# Patient Record
Sex: Male | Born: 1950 | Race: White | Hispanic: No | Marital: Married | Smoking: Former smoker
Health system: Southern US, Community
[De-identification: ages and names within clinical notes are randomized; demographics above are authoritative.]

## PROBLEM LIST (undated history)

## (undated) DIAGNOSIS — I10 Essential (primary) hypertension: Secondary | ICD-10-CM

## (undated) DIAGNOSIS — I4891 Unspecified atrial fibrillation: Secondary | ICD-10-CM

## (undated) DIAGNOSIS — E785 Hyperlipidemia, unspecified: Secondary | ICD-10-CM

## (undated) DIAGNOSIS — K635 Polyp of colon: Secondary | ICD-10-CM

## (undated) DIAGNOSIS — E119 Type 2 diabetes mellitus without complications: Secondary | ICD-10-CM

## (undated) HISTORY — DX: Type 2 diabetes mellitus without complications: E11.9

## (undated) HISTORY — DX: Polyp of colon: K63.5

## (undated) HISTORY — PX: CARPAL TUNNEL RELEASE: SHX101

## (undated) HISTORY — PX: UMBILICAL HERNIA REPAIR: SHX196

## (undated) HISTORY — PX: ROTATOR CUFF REPAIR: SHX139

## (undated) HISTORY — DX: Essential (primary) hypertension: I10

## (undated) HISTORY — DX: Hyperlipidemia, unspecified: E78.5

## (undated) HISTORY — DX: Unspecified atrial fibrillation: I48.91

---

## 2012-05-28 ENCOUNTER — Encounter: Payer: Self-pay | Admitting: Physician Assistant

## 2012-06-02 ENCOUNTER — Encounter: Payer: Self-pay | Admitting: Physician Assistant

## 2012-06-02 ENCOUNTER — Ambulatory Visit (INDEPENDENT_AMBULATORY_CARE_PROVIDER_SITE_OTHER): Payer: BC Managed Care – PPO | Admitting: Physician Assistant

## 2012-06-02 VITALS — BP 136/72 | HR 97 | Ht 70.0 in | Wt 274.2 lb

## 2012-06-02 DIAGNOSIS — E119 Type 2 diabetes mellitus without complications: Secondary | ICD-10-CM | POA: Insufficient documentation

## 2012-06-02 DIAGNOSIS — I4891 Unspecified atrial fibrillation: Secondary | ICD-10-CM

## 2012-06-02 NOTE — Patient Instructions (Addendum)
You do not need a colonoscopy at this time. You can have a follow up colonoscopy in 2 years.

## 2012-06-02 NOTE — Progress Notes (Signed)
  Subjective:    Patient ID: Kenneth Hahn, male    DOB: July 01, 1950, 63 y.o.   MRN: [id number]  HPI    Review of Systems  Constitutional: Negative.   HENT: Negative.   Eyes: Negative.   Respiratory: Negative.   Cardiovascular: Negative.   Gastrointestinal: Positive for diarrhea.  Endocrine: Negative.   Genitourinary: Negative.   Allergic/Immunologic: Negative.   Neurological: Negative.   Hematological: Negative.   Psychiatric/Behavioral: Negative.    Outpatient Encounter Prescriptions as of 06/02/2012  Medication Sig Dispense Refill  . amLODipine-benazepril (LOTREL) 5-20 MG per capsule Take 1 capsule by mouth daily.      Marland Kitchen atenolol (TENORMIN) 25 MG tablet Take 25 mg by mouth 2 (two) times daily. Take one tablet morning and evening      . Cholecalciferol (VITAMIN D-3) 5000 UNITS TABS Take 1 tablet by mouth daily.      . Choline Fenofibrate (TRILIPIX) 135 MG capsule Take 135 mg by mouth daily.      . Fish Oil-Cholecalciferol (FISH OIL + D3) 1200-1000 MG-UNIT CAPS Take 1 tablet by mouth daily.      Marland Kitchen glimepiride (AMARYL) 2 MG tablet Take 2 mg by mouth daily before breakfast.      . Glucos-Chondroit-Hyaluron-MSM (GLUCOSAMINE CHONDROITIN JOINT PO) Take 1 tablet by mouth daily.      . Multiple Vitamin (MULTIVITAMIN) tablet Take 1 tablet by mouth daily.      . sitaGLIPtan-metformin (JANUMET) 50-1000 MG per tablet Take 1 tablet by mouth 2 (two) times daily with a meal.      . warfarin (COUMADIN) 5 MG tablet Take 5 mg by mouth daily.       No facility-administered encounter medications on file as of 06/02/2012.  No Known Allergies  Patient Active Problem List  Diagnosis  . Atrial fibrillation  . HTN (hypertension)  . DM (diabetes mellitus)  . Hx of colonic polyps  . Chronic anticoagulation   History  Substance Use Topics  . Smoking status: Former Smoker    Quit date: 03/26/1973  . Smokeless tobacco: Never Used  . Alcohol Use: Yes     Comment: 2 drinks a day   family history  includes Diabetes in his father; Heart disease in his father; Lymphoma in his mother; and Squamous cell carcinoma in his sister.     Objective:   Physical Exam well-developed older white male in no acute distress, pleasant blood pressure 136 her 72 pulse 97 height 5 foot 10 weight 274. HEENT; nontraumatic normocephalic EOMI PERRLA sclera anicteric conjunctiva pink, Neck; supple no JVD, Cardiovascular; irregular rate and rhythm with S1-S2 no murmur or gallop, Pulmonary ;clear bilaterally, Abdomen; large soft nontender nondistended no palpable mass or hepatosplenomegaly he has some mild tenderness on the right directly over the right lateral costal margin., Rectal; exam not done, Extremities; no clubbing cyanosis or edema skin warm and dry, Psych;ood and affect normal and appropriate        Assessment & Plan:  #30 62 year old male with history of hyperplastic colon polyps with last colonoscopy 2010 year to discuss timing of followup colonoscopy. Average risk with no family history of colon cancer #2 chronic anticoagulation with Coumadin #3 atrial fibrillation #4hypertension #5 adult onset diabetes mellitus #6 intermittent diarrhea no specific triggers, most consistent with IBS  Plan; patient will need followup colonoscopy at 7 year interval.  Have asked him to come back in for followup in 2 years or sooner if he develops any other GI symptoms.

## 2013-01-29 ENCOUNTER — Other Ambulatory Visit: Payer: Self-pay

## 2014-02-08 ENCOUNTER — Emergency Department: Payer: Self-pay | Admitting: Emergency Medicine

## 2014-02-08 LAB — URINALYSIS, COMPLETE
Bacteria: NONE SEEN
Bilirubin,UR: NEGATIVE
Blood: NEGATIVE
Glucose,UR: >=500 mg/dL (ref 0–75)
LEUKOCYTE ESTERASE: NEGATIVE
Nitrite: NEGATIVE
PROTEIN: NEGATIVE
Ph: 5 (ref 4.5–8.0)
RBC,UR: 1 /HPF (ref 0–5)
Specific Gravity: 1.028 (ref 1.003–1.030)
Squamous Epithelial: NONE SEEN

## 2014-02-08 LAB — COMPREHENSIVE METABOLIC PANEL
ALBUMIN: 4.2 g/dL (ref 3.4–5.0)
ALK PHOS: 65 U/L
AST: 87 U/L — AB (ref 15–37)
Anion Gap: 10 (ref 7–16)
BUN: 21 mg/dL — AB (ref 7–18)
Bilirubin,Total: 0.9 mg/dL (ref 0.2–1.0)
Calcium, Total: 8.9 mg/dL (ref 8.5–10.1)
Chloride: 96 mmol/L — ABNORMAL LOW (ref 98–107)
Co2: 27 mmol/L (ref 21–32)
Creatinine: 1.84 mg/dL — ABNORMAL HIGH (ref 0.60–1.30)
EGFR (African American): 48 — ABNORMAL LOW
EGFR (Non-African Amer.): 39 — ABNORMAL LOW
Glucose: 525 mg/dL (ref 65–99)
Osmolality: 293 (ref 275–301)
Potassium: 4.4 mmol/L (ref 3.5–5.1)
SGPT (ALT): 66 U/L — ABNORMAL HIGH
SODIUM: 133 mmol/L — AB (ref 136–145)
TOTAL PROTEIN: 7.9 g/dL (ref 6.4–8.2)

## 2014-02-08 LAB — CBC
HCT: 42.7 % (ref 40.0–52.0)
HGB: 14.5 g/dL (ref 13.0–18.0)
MCH: 32.5 pg (ref 26.0–34.0)
MCHC: 33.9 g/dL (ref 32.0–36.0)
MCV: 96 fL (ref 80–100)
PLATELETS: 193 10*3/uL (ref 150–440)
RBC: 4.44 10*6/uL (ref 4.40–5.90)
RDW: 12.3 % (ref 11.5–14.5)
WBC: 9.6 10*3/uL (ref 3.8–10.6)

## 2015-11-15 ENCOUNTER — Ambulatory Visit
Admission: RE | Admit: 2015-11-15 | Discharge: 2015-11-15 | Disposition: A | Discharge status: Home / Self Care | Payer: Medicare Other | Source: Ambulatory Visit | Attending: Family Medicine | Admitting: Family Medicine

## 2015-11-15 ENCOUNTER — Other Ambulatory Visit: Payer: Self-pay | Admitting: Family Medicine

## 2015-11-15 DIAGNOSIS — R609 Edema, unspecified: Secondary | ICD-10-CM

## 2015-11-15 DIAGNOSIS — M7989 Other specified soft tissue disorders: Secondary | ICD-10-CM | POA: Diagnosis present

## 2015-12-27 DIAGNOSIS — I200 Unstable angina: Secondary | ICD-10-CM | POA: Diagnosis present

## 2016-01-04 ENCOUNTER — Encounter
Admission: RE | Disposition: A | Discharge status: Home / Self Care | Payer: Self-pay | Source: Ambulatory Visit | Attending: Cardiology

## 2016-01-04 ENCOUNTER — Encounter: Payer: Self-pay | Admitting: *Deleted

## 2016-01-04 ENCOUNTER — Ambulatory Visit
Admission: RE | Admit: 2016-01-04 | Discharge: 2016-01-05 | Disposition: A | Discharge status: Home / Self Care | Payer: Medicare Other | Source: Ambulatory Visit | Attending: Cardiology | Admitting: Cardiology

## 2016-01-04 DIAGNOSIS — E7800 Pure hypercholesterolemia, unspecified: Secondary | ICD-10-CM | POA: Diagnosis not present

## 2016-01-04 DIAGNOSIS — E039 Hypothyroidism, unspecified: Secondary | ICD-10-CM | POA: Insufficient documentation

## 2016-01-04 DIAGNOSIS — I10 Essential (primary) hypertension: Secondary | ICD-10-CM | POA: Insufficient documentation

## 2016-01-04 DIAGNOSIS — Z23 Encounter for immunization: Secondary | ICD-10-CM | POA: Insufficient documentation

## 2016-01-04 DIAGNOSIS — I2510 Atherosclerotic heart disease of native coronary artery without angina pectoris: Secondary | ICD-10-CM | POA: Diagnosis not present

## 2016-01-04 DIAGNOSIS — E119 Type 2 diabetes mellitus without complications: Secondary | ICD-10-CM | POA: Diagnosis not present

## 2016-01-04 DIAGNOSIS — I200 Unstable angina: Secondary | ICD-10-CM | POA: Diagnosis present

## 2016-01-04 HISTORY — PX: CARDIAC CATHETERIZATION: SHX172

## 2016-01-04 LAB — GLUCOSE, CAPILLARY: Glucose-Capillary: 305 mg/dL — ABNORMAL HIGH (ref 65–99)

## 2016-01-04 SURGERY — LEFT HEART CATH AND CORONARY ANGIOGRAPHY
Anesthesia: Moderate Sedation

## 2016-01-04 MED ORDER — SODIUM CHLORIDE 0.9% FLUSH: 3.0000 mL | INTRAVENOUS | Status: DC | PRN

## 2016-01-04 MED ORDER — TAMSULOSIN HCL 0.4 MG PO CAPS
0.4000 mg | ORAL_CAPSULE | Freq: Every day | ORAL | Status: DC
  Administered 2016-01-04: 0.4 mg via ORAL
  Filled 2016-01-04: qty 1

## 2016-01-04 MED ORDER — SODIUM CHLORIDE 0.9% FLUSH: 3.0000 mL | Freq: Two times a day (BID) | INTRAVENOUS | Status: DC

## 2016-01-04 MED ORDER — SODIUM CHLORIDE 0.9 % IV SOLN: 250.0000 mL | INTRAVENOUS | Status: DC | PRN

## 2016-01-04 MED ORDER — INFLUENZA VAC SPLIT QUAD 0.5 ML IM SUSY
0.5000 mL | PREFILLED_SYRINGE | INTRAMUSCULAR | Status: AC
  Administered 2016-01-05: 0.5 mL via INTRAMUSCULAR
  Filled 2016-01-04: qty 0.5

## 2016-01-04 MED ORDER — FENTANYL CITRATE (PF) 100 MCG/2ML IJ SOLN
INTRAMUSCULAR | Status: AC
  Filled 2016-01-04: qty 2

## 2016-01-04 MED ORDER — SODIUM CHLORIDE 0.9 % WEIGHT BASED INFUSION: 1.0000 mL/kg/h | INTRAVENOUS | Status: DC

## 2016-01-04 MED ORDER — ASPIRIN 81 MG PO CHEW
CHEWABLE_TABLET | ORAL | Status: DC | PRN
  Administered 2016-01-04: 243 mg via ORAL

## 2016-01-04 MED ORDER — ASPIRIN 81 MG PO CHEW
CHEWABLE_TABLET | ORAL | Status: AC
  Filled 2016-01-04: qty 3

## 2016-01-04 MED ORDER — ASPIRIN 81 MG PO CHEW: 81.0000 mg | CHEWABLE_TABLET | ORAL | Status: DC

## 2016-01-04 MED ORDER — BIVALIRUDIN BOLUS VIA INFUSION - CUPID
INTRAVENOUS | Status: DC | PRN
  Administered 2016-01-04: 93 mg via INTRAVENOUS

## 2016-01-04 MED ORDER — FENTANYL CITRATE (PF) 100 MCG/2ML IJ SOLN
INTRAMUSCULAR | Status: DC | PRN
  Administered 2016-01-04: 50 ug via INTRAVENOUS

## 2016-01-04 MED ORDER — ASPIRIN 81 MG PO CHEW
CHEWABLE_TABLET | ORAL | Status: AC
  Administered 2016-01-04: 81 mg
  Filled 2016-01-04: qty 1

## 2016-01-04 MED ORDER — SODIUM CHLORIDE 0.9% FLUSH
3.0000 mL | Freq: Two times a day (BID) | INTRAVENOUS | Status: DC
  Administered 2016-01-04: 3 mL via INTRAVENOUS

## 2016-01-04 MED ORDER — CLOPIDOGREL BISULFATE 75 MG PO TABS: 75.0000 mg | ORAL_TABLET | Freq: Every day | ORAL | 4 refills | Status: AC

## 2016-01-04 MED ORDER — CLOPIDOGREL BISULFATE 75 MG PO TABS
75.0000 mg | ORAL_TABLET | Freq: Every day | ORAL | Status: DC
  Administered 2016-01-05: 75 mg via ORAL
  Filled 2016-01-04: qty 1

## 2016-01-04 MED ORDER — ATENOLOL 25 MG PO TABS
25.0000 mg | ORAL_TABLET | Freq: Every day | ORAL | Status: DC
  Administered 2016-01-04 – 2016-01-05 (×2): 25 mg via ORAL
  Filled 2016-01-04 (×2): qty 1

## 2016-01-04 MED ORDER — MIDAZOLAM HCL 2 MG/2ML IJ SOLN
INTRAMUSCULAR | Status: DC | PRN
  Administered 2016-01-04: 1 mg via INTRAVENOUS

## 2016-01-04 MED ORDER — ACETAMINOPHEN 325 MG PO TABS: 650.0000 mg | ORAL_TABLET | ORAL | Status: DC | PRN

## 2016-01-04 MED ORDER — METFORMIN HCL ER 500 MG PO TB24
500.0000 mg | ORAL_TABLET | Freq: Every day | ORAL | Status: DC
  Filled 2016-01-04: qty 1

## 2016-01-04 MED ORDER — NITROGLYCERIN 5 MG/ML IV SOLN
INTRAVENOUS | Status: AC
  Filled 2016-01-04: qty 10

## 2016-01-04 MED ORDER — HEPARIN (PORCINE) IN NACL 2-0.9 UNIT/ML-% IJ SOLN
INTRAMUSCULAR | Status: AC
  Filled 2016-01-04: qty 500

## 2016-01-04 MED ORDER — ATORVASTATIN CALCIUM 20 MG PO TABS
80.0000 mg | ORAL_TABLET | Freq: Every day | ORAL | Status: DC
  Administered 2016-01-04: 80 mg via ORAL
  Filled 2016-01-04: qty 4

## 2016-01-04 MED ORDER — ONDANSETRON HCL 4 MG/2ML IJ SOLN: 4.0000 mg | Freq: Four times a day (QID) | INTRAMUSCULAR | Status: DC | PRN

## 2016-01-04 MED ORDER — SODIUM CHLORIDE 0.9 % IV SOLN
INTRAVENOUS | Status: DC | PRN
  Administered 2016-01-04: 1.75 mg/kg/h via INTRAVENOUS

## 2016-01-04 MED ORDER — INSULIN GLARGINE 100 UNIT/ML ~~LOC~~ SOLN: 62.0000 [IU] | Freq: Every day | SUBCUTANEOUS | 11 refills | Status: AC

## 2016-01-04 MED ORDER — SODIUM CHLORIDE 0.9 % WEIGHT BASED INFUSION: 3.0000 mL/kg/h | INTRAVENOUS | Status: DC

## 2016-01-04 MED ORDER — NITROGLYCERIN 1 MG/10 ML FOR IR/CATH LAB
INTRA_ARTERIAL | Status: DC | PRN
  Administered 2016-01-04: 200 ug via INTRACORONARY

## 2016-01-04 MED ORDER — SODIUM CHLORIDE 0.9 % WEIGHT BASED INFUSION: 3.0000 mL/kg/h | INTRAVENOUS | Status: AC

## 2016-01-04 MED ORDER — AMLODIPINE BESYLATE 5 MG PO TABS
10.0000 mg | ORAL_TABLET | Freq: Every day | ORAL | Status: DC
  Administered 2016-01-04 – 2016-01-05 (×2): 10 mg via ORAL
  Filled 2016-01-04 (×2): qty 2

## 2016-01-04 MED ORDER — CLOPIDOGREL BISULFATE 75 MG PO TABS
ORAL_TABLET | ORAL | Status: DC | PRN
  Administered 2016-01-04: 600 mg via ORAL

## 2016-01-04 MED ORDER — INSULIN GLARGINE 100 UNIT/ML ~~LOC~~ SOLN
62.0000 [IU] | Freq: Every day | SUBCUTANEOUS | Status: DC
  Administered 2016-01-04: 62 [IU] via SUBCUTANEOUS
  Filled 2016-01-04 (×2): qty 0.62

## 2016-01-04 MED ORDER — ASPIRIN 81 MG PO CHEW
81.0000 mg | CHEWABLE_TABLET | ORAL | Status: DC
Start: 2016-01-05 — End: 2016-01-04

## 2016-01-04 MED ORDER — CLOPIDOGREL BISULFATE 75 MG PO TABS
ORAL_TABLET | ORAL | Status: AC
  Filled 2016-01-04: qty 8

## 2016-01-04 MED ORDER — BIVALIRUDIN 250 MG IV SOLR
INTRAVENOUS | Status: AC
  Filled 2016-01-04: qty 250

## 2016-01-04 MED ORDER — ASPIRIN EC 325 MG PO TBEC
325.0000 mg | DELAYED_RELEASE_TABLET | Freq: Every day | ORAL | Status: DC
  Administered 2016-01-05: 325 mg via ORAL
  Filled 2016-01-04: qty 1

## 2016-01-04 MED ORDER — BIVALIRUDIN 250 MG IV SOLR
INTRAVENOUS | Status: AC
Start: 2016-01-04 — End: 2016-01-04
  Filled 2016-01-04: qty 250

## 2016-01-04 MED ORDER — INFLUENZA VAC SPLIT QUAD 0.5 ML IM SUSY: 0.5000 mL | PREFILLED_SYRINGE | INTRAMUSCULAR | 0 refills | Status: AC

## 2016-01-04 MED ORDER — MIDAZOLAM HCL 2 MG/2ML IJ SOLN
INTRAMUSCULAR | Status: AC
  Filled 2016-01-04: qty 2

## 2016-01-04 MED ORDER — IOPAMIDOL (ISOVUE-300) INJECTION 61%
INTRAVENOUS | Status: DC | PRN
  Administered 2016-01-04: 160 mL via INTRA_ARTERIAL

## 2016-01-04 MED ORDER — FUROSEMIDE 20 MG PO TABS
20.0000 mg | ORAL_TABLET | Freq: Every day | ORAL | Status: DC
  Administered 2016-01-04 – 2016-01-05 (×2): 20 mg via ORAL
  Filled 2016-01-04 (×2): qty 1

## 2016-01-04 MED ORDER — ONDANSETRON HCL 4 MG/2ML IJ SOLN
4.0000 mg | Freq: Four times a day (QID) | INTRAMUSCULAR | 0 refills | Status: DC | PRN
Start: 2016-01-04 — End: 2017-11-12

## 2016-01-04 MED ORDER — IOPAMIDOL (ISOVUE-300) INJECTION 61%
INTRAVENOUS | Status: DC | PRN
  Administered 2016-01-04: 130 mL via INTRA_ARTERIAL

## 2016-01-04 MED ORDER — ATORVASTATIN CALCIUM 80 MG PO TABS: 80.0000 mg | ORAL_TABLET | Freq: Every day | ORAL | 4 refills | Status: DC

## 2016-01-04 MED ORDER — BENAZEPRIL HCL 20 MG PO TABS
20.0000 mg | ORAL_TABLET | Freq: Every day | ORAL | Status: DC
  Administered 2016-01-04 – 2016-01-05 (×2): 20 mg via ORAL
  Filled 2016-01-04 (×2): qty 1

## 2016-01-04 SURGICAL SUPPLY — 17 items
BALLN TREK RX 2.5X20 (BALLOONS) ×3
BALLOON TREK RX 2.5X20 (BALLOONS) ×2 IMPLANT
CATH 5FR JL4 DIAGNOSTIC (CATHETERS) ×3 IMPLANT
CATH 5FR JR4 DIAGNOSTIC (CATHETERS) ×3 IMPLANT
CATH 5FR PIGTAIL DIAGNOSTIC (CATHETERS) ×3 IMPLANT
CATH VISTA GUIDE 6FR XB3.5 (CATHETERS) ×3 IMPLANT
DEVICE CLOSURE MYNXGRIP 6/7F (Vascular Products) ×3 IMPLANT
DEVICE INFLAT 30 PLUS (MISCELLANEOUS) ×3 IMPLANT
KIT MANI 3VAL PERCEP (MISCELLANEOUS) ×3 IMPLANT
NEEDLE PERC 18GX7CM (NEEDLE) ×3 IMPLANT
PACK CARDIAC CATH (CUSTOM PROCEDURE TRAY) ×3 IMPLANT
SHEATH AVANTI 6FR X 11CM (SHEATH) ×3 IMPLANT
SHEATH PINNACLE 5F 10CM (SHEATH) ×3 IMPLANT
STENT XIENCE ALPINE RX 2.75X28 (Permanent Stent) ×3 IMPLANT
STENT XIENCE ALPINE RX 3.25X12 (Permanent Stent) ×3 IMPLANT
WIRE ASAHI PROWATER 180CM (WIRE) ×3 IMPLANT
WIRE EMERALD 3MM-J .035X150CM (WIRE) ×3 IMPLANT

## 2016-01-04 NOTE — Discharge Summary (Signed)
   [facility] Cardiology Discharge Summary  Patient ID: Kenneth Hahn MRN: [id number] DOB/AGE: November 11, 1950 65 y.o.  Admit date: 01/04/2016 Discharge date: 01/04/2016  Primary Discharge Diagnosis: Ischemic chest pain I20.9 Secondary Discharge Diagnosis diabetes, high blood pressure and high cholesterol  Significant Diagnostic Studies: Cardiac cath with left ventricular angiogram and selective coronary injection as well as PCI and stent placement of left anterior descending.  Hospital Course: The patient was admitted to [facility] for cardiac cath with selective coronary angiogram after full consent, risk and benefits explained, and time out called with all approprate details voiced and discussed. The patient has had progressive canadian class 3 angina with high probability risk stress test consistent with ischemic chest pain and or anginal equivalent with coronary artery risk factors including diabetes, high blood pressure and high cholesterol. The procedure was performed without complication and it revealed normal left ventricular function with ejection fraction of 50%.  It was found that the patient had severe 1 vessel coronary atherosclerosis with significant left anterior descending stenosis requiring further intervention. Therefore, the patient had a PCI and drug eluding stent placed without complication. The patient has been ambulating without further significant symptoms and has reached his maximal hospital benefit and will be discharged to home in good condition.  Cardiac rehabilitation has been discussed and recommended. Medication management of cardiovascular risk factors will be given post discharge and modified as an outpatient.   Discharge Exam: Blood pressure 130/67, pulse 69, temperature 97.8 F (36.6 C), temperature source Oral, resp. rate 19, height  (1.753 m), weight 131.4 kg (289 lb 9.6 oz), SpO2 96 %.  Constitutional: Alet oriented to person, place, and time. No  distress.  HENT: No nasal discharge.  Head: Normocephalic and atraumatic.  Eyes: Pupils are equal and round. No discharge.  Neck: Normal range of motion. Neck supple. No JVD present. No thyromegaly present.  Cardiovascular: Normal rate, regular rhythm, normal S1 S2, no gallop, no friction rub. No murmur Pulmonary/Chest: Effort normal, No stridor. No respiratory distress. no wheezes.  no rales.    Abdominal: Soft. Bowel sounds are normal.  no distension.  no tenderness. There is no rebound and no guarding.  Musculoskeletal: No edema, no cyanosis, normal pulses, no bleeding, Normal range of motion. no tenderness.  Neurological:  alert and oriented to person, place, and time. Coordination normal.  Skin: Skin is warm and dry. No rash noted. No erythema. No pallor.  Psychiatric:  normal mood and affect. behavior is normal.    Labs:   Lab Results  Component Value Date   WBC 9.6 02/08/2014   HGB 14.5 02/08/2014   HCT 42.7 02/08/2014   MCV 96 02/08/2014   PLT 193 02/08/2014   No results for input(s): NA, K, CL, CO2, BUN, CREATININE, CALCIUM, PROT, BILITOT, ALKPHOS, ALT, AST, GLUCOSE in the last 168 hours.  Invalid input(s): LABALBU  EKG: NSR without evidence of new changes  FOLLOW UP IN ONE TO TWO WEEKS Discharge Instructions    AMB Referral to Cardiac Rehabilitation - Phase II    Complete by:  As directed    Diagnosis:  Coronary Stents      THE PATIENT  SHALL BRING ALL MEDICATIONS TO FOLLOW UP APPOINTMENT  Signed:  Lamar Blinks MD, [facility] 01/04/2016, 1:30 PM

## 2016-01-04 NOTE — Progress Notes (Signed)
Received from [facility] today post PCI,no complications noted,no pain no hematoma,no bleeding,ambulating,denies chest pain,vital sighs within normal limits.

## 2016-01-04 NOTE — Progress Notes (Signed)
Patient is alert and oriented. Reporting no pain during recovery time. Right groin site WNL, pulses WNL with preprocedure edema to bilateral legs and feet. VSS. [facility].

## 2016-01-04 NOTE — Progress Notes (Signed)
Pharmacy notified of need to complete PTA med list.

## 2016-01-04 NOTE — Care Management (Signed)
Patient placed in outpatient bed after elective cardiac cath with pci.  At present, he has not been placed on any cost prohibitive anti platelet medications

## 2016-01-04 NOTE — Discharge Instructions (Signed)

## 2016-01-05 DIAGNOSIS — I2510 Atherosclerotic heart disease of native coronary artery without angina pectoris: Secondary | ICD-10-CM | POA: Diagnosis not present

## 2016-01-05 LAB — CBC
HCT: 33.2 % — ABNORMAL LOW (ref 40.0–52.0)
Hemoglobin: 11.3 g/dL — ABNORMAL LOW (ref 13.0–18.0)
MCH: 31.8 pg (ref 26.0–34.0)
MCHC: 34 g/dL (ref 32.0–36.0)
MCV: 93.5 fL (ref 80.0–100.0)
Platelets: 181 10*3/uL (ref 150–440)
RBC: 3.55 MIL/uL — ABNORMAL LOW (ref 4.40–5.90)
RDW: 14.3 % (ref 11.5–14.5)
WBC: 7.2 10*3/uL (ref 3.8–10.6)

## 2016-01-05 LAB — BASIC METABOLIC PANEL
Anion gap: 8 (ref 5–15)
BUN: 13 mg/dL (ref 6–20)
CALCIUM: 8.6 mg/dL — AB (ref 8.9–10.3)
CO2: 28 mmol/L (ref 22–32)
Chloride: 101 mmol/L (ref 101–111)
Creatinine, Ser: 0.99 mg/dL (ref 0.61–1.24)
GFR calc Af Amer: >60 mL/min (ref >60)
GLUCOSE: 162 mg/dL — AB (ref 65–99)
Potassium: 3.6 mmol/L (ref 3.5–5.1)
Sodium: 137 mmol/L (ref 135–145)

## 2016-01-05 MED ORDER — CLOPIDOGREL BISULFATE 75 MG PO TABS: 75.0000 mg | ORAL_TABLET | Freq: Every day | ORAL | 5 refills | Status: DC

## 2016-01-05 MED ORDER — PNEUMOCOCCAL VAC POLYVALENT 25 MCG/0.5ML IJ INJ: 0.5000 mL | INJECTION | INTRAMUSCULAR | Status: DC

## 2016-01-05 MED ORDER — PNEUMOCOCCAL VAC POLYVALENT 25 MCG/0.5ML IJ INJ
0.5000 mL | INJECTION | Freq: Once | INTRAMUSCULAR | Status: AC
  Administered 2016-01-05: 0.5 mL via INTRAMUSCULAR
  Filled 2016-01-05: qty 0.5

## 2016-01-05 NOTE — Progress Notes (Signed)
Discharge instructions explained to pt/ verbalized an understanding/ iv and tele removed/ flu and pnemo vaccine given as reqeusted/ pt request to ambulate off unit / RN walked with pt to car.

## 2016-01-16 ENCOUNTER — Encounter: Payer: Medicare Other | Attending: Cardiology | Admitting: *Deleted

## 2016-01-16 VITALS — Ht 69.9 in | Wt 289.2 lb

## 2016-01-16 DIAGNOSIS — Z955 Presence of coronary angioplasty implant and graft: Secondary | ICD-10-CM | POA: Insufficient documentation

## 2016-01-16 NOTE — Patient Instructions (Addendum)
Patient Instructions  Patient Details  Name: Kenneth HumphreysJoseph Attila Hahn MRN: [id number] Date of Birth: 11/23/1950 Referring Provider:  Marcina MillardParaschos, Alexander, MD  Below are the personal goals you chose as well as exercise and nutrition goals. Our goal is to help you keep on track towards obtaining and maintaining your goals. We will be discussing your progress on these goals with you throughout the program.  Initial Exercise Prescription:     Initial Exercise Prescription - 01/16/16 1400      Date of Initial Exercise RX and Referring Provider   Date 01/16/16   Referring Provider Arnoldo HookerKowalski, Bruce MD     Treadmill   MPH 2   Grade 0   Minutes 15   METs 2.53     NuStep   Level 1   Minutes 15   METs 2     Biostep-RELP   Level 1   Minutes 15   METs 2     Prescription Details   Frequency (times per week) 3   Duration Progress to 45 minutes of aerobic exercise without signs/symptoms of physical distress     Intensity   THRR 40-80% of Max Heartrate 107-138   Ratings of Perceived Exertion 11-15   Perceived Dyspnea 0-4     Progression   Progression Continue to progress workloads to maintain intensity without signs/symptoms of physical distress.     Resistance Training   Training Prescription Yes   Weight 3 lbs   Reps 10-12      Exercise Goals: Frequency: Be able to perform aerobic exercise three times per week working toward 3-5 days per week.  Intensity: Work with a perceived exertion of 11 (fairly light) - 15 (hard) as tolerated. Follow your new exercise prescription and watch for changes in prescription as you progress with the program. Changes will be reviewed with you when they are made.  Duration: You should be able to do 30 minutes of continuous aerobic exercise in addition to a 5 minute warm-up and a 5 minute cool-down routine.  Nutrition Goals: Your personal nutrition goals will be established when you do your nutrition analysis with the dietician.  The following  are nutrition guidelines to follow: Cholesterol < 200mg /day Sodium < 1500mg /day Fiber: Men over 50 yrs - 30 grams per day  Personal Goals:     Personal Goals and Risk Factors at Admission - 01/16/16 1225      Core Components/Risk Factors/Patient Goals on Admission    Weight Management Yes;Obesity;Weight Loss   Intervention Weight Management: Develop a combined nutrition and exercise program designed to reach desired caloric intake, while maintaining appropriate intake of nutrient and fiber, sodium and fats, and appropriate energy expenditure required for the weight goal.;Weight Management: Provide education and appropriate resources to help participant work on and attain dietary goals.;Weight Management/Obesity: Establish reasonable short term and long term weight goals.;Obesity: Provide education and appropriate resources to help participant work on and attain dietary goals.   Admit Weight 289 lb 3.2 oz (131.2 kg)   Goal Weight: Short Term 285 lb (129.3 kg)   Goal Weight: Long Term 220 lb (99.8 kg)   Expected Outcomes Short Term: Continue to assess and modify interventions until short term weight is achieved;Long Term: Adherence to nutrition and physical activity/exercise program aimed toward attainment of established weight goal;Weight Loss: Understanding of general recommendations for a balanced deficit meal plan, which promotes 1-2 lb weight loss per week and includes a negative energy balance of [phone] kcal/d;Understanding recommendations for meals to include 15-35% energy  as protein, 25-35% energy from fat, 35-60% energy from carbohydrates, less than 200mg  of dietary cholesterol, 20-35 gm of total fiber daily;Understanding of distribution of calorie intake throughout the day with the consumption of 4-5 meals/snacks   Sedentary Yes   Intervention Provide advice, education, support and counseling about physical activity/exercise needs.;Develop an individualized exercise prescription for  aerobic and resistive training based on initial evaluation findings, risk stratification, comorbidities and participant's personal goals.   Expected Outcomes Achievement of increased cardiorespiratory fitness and enhanced flexibility, muscular endurance and strength shown through measurements of functional capacity and personal statement of participant.   Increase Strength and Stamina Yes   Intervention Provide advice, education, support and counseling about physical activity/exercise needs.;Develop an individualized exercise prescription for aerobic and resistive training based on initial evaluation findings, risk stratification, comorbidities and participant's personal goals.   Expected Outcomes Achievement of increased cardiorespiratory fitness and enhanced flexibility, muscular endurance and strength shown through measurements of functional capacity and personal statement of participant.   Diabetes Yes   Intervention Provide education about signs/symptoms and action to take for hypo/hyperglycemia.;Provide education about proper nutrition, including hydration, and aerobic/resistive exercise prescription along with prescribed medications to achieve blood glucose in normal ranges: Fasting glucose 65-99 mg/dL   Expected Outcomes Short Term: Participant verbalizes understanding of the signs/symptoms and immediate care of hyper/hypoglycemia, proper foot care and importance of medication, aerobic/resistive exercise and nutrition plan for blood glucose control.;Long Term: Attainment of HbA1C < 7%.   Heart Failure Yes   Intervention Provide a combined exercise and nutrition program that is supplemented with education, support and counseling about heart failure. Directed toward relieving symptoms such as shortness of breath, decreased exercise tolerance, and extremity edema.   Expected Outcomes Improve functional capacity of life;Short term: Attendance in program 2-3 days a week with increased exercise capacity.  Reported lower sodium intake. Reported increased fruit and vegetable intake. Reports medication compliance.;Short term: Daily weights obtained and reported for increase. Utilizing diuretic protocols set by physician.;Long term: Adoption of self-care skills and reduction of barriers for early signs and symptoms recognition and intervention leading to self-care maintenance.   Hypertension Yes   Intervention Provide education on lifestyle modifcations including regular physical activity/exercise, weight management, moderate sodium restriction and increased consumption of fresh fruit, vegetables, and low fat dairy, alcohol moderation, and smoking cessation.;Monitor prescription use compliance.   Expected Outcomes Short Term: Continued assessment and intervention until BP is < 140/90mm HG in hypertensive participants. < 130/26mm HG in hypertensive participants with diabetes, heart failure or chronic kidney disease.;Long Term: Maintenance of blood pressure at goal levels.   Lipids Yes   Intervention Provide education and support for participant on nutrition & aerobic/resistive exercise along with prescribed medications to achieve LDL 70mg , HDL >40mg .   Expected Outcomes Short Term: Participant states understanding of desired cholesterol values and is compliant with medications prescribed. Participant is following exercise prescription and nutrition guidelines.;Long Term: Cholesterol controlled with medications as prescribed, with individualized exercise RX and with personalized nutrition plan. Value goals: LDL < 70mg , HDL > 40 mg.      Tobacco Use Initial Evaluation: History  Smoking Status  . Former Smoker  . Quit date: 03/26/1973  Smokeless Tobacco  . Never Used    Copy of goals given to participant.

## 2016-01-16 NOTE — Progress Notes (Addendum)
Cardiac Individual Treatment Plan  Patient Details  Name: Kenneth Hahn MRN: [id number] Date of Birth: 27-Nov-1950 Referring Provider: Dr. Cassie Freer  Flowsheet Row Cardiac Rehab from 01/16/2016 in [facility] Cardiac and Pulmonary Rehab  Referring Provider  Kenneth Hooker MD      Initial Encounter Date:  Flowsheet Row Cardiac Rehab from 01/16/2016 in [facility] Cardiac and Pulmonary Rehab  Date  01/16/16  Referring Provider  Kenneth Hooker MD    01/16/2016  Visit Diagnosis: Status post coronary artery stent placement  Patient's Home Medications on Admission:  Current Outpatient Prescriptions:  .  amLODipine-benazepril (LOTREL) 5-20 MG per capsule, Take 1 capsule by mouth daily., Disp: , Rfl:  .  atenolol (TENORMIN) 25 MG tablet, Take 25 mg by mouth 2 (two) times daily. Take one tablet morning and evening, Disp: , Rfl:  .  atorvastatin (LIPITOR) 80 MG tablet, Take 1 tablet (80 mg total) by mouth daily at 6 PM., Disp: 90 tablet, Rfl: 4 .  Cholecalciferol (VITAMIN D-3) 5000 UNITS TABS, Take 1 tablet by mouth daily., Disp: , Rfl:  .  clopidogrel (PLAVIX) 75 MG tablet, Take 1 tablet (75 mg total) by mouth daily with breakfast., Disp: 90 tablet, Rfl: 4 .  clopidogrel (PLAVIX) 75 MG tablet, Take 1 tablet (75 mg total) by mouth daily., Disp: 30 tablet, Rfl: 5 .  furosemide (LASIX) 20 MG tablet, Take 20 mg by mouth daily., Disp: , Rfl:  .  insulin glargine (LANTUS) 100 UNIT/ML injection, Inject 0.62 mLs (62 Units total) into the skin at bedtime., Disp: 10 mL, Rfl: 11 .  METFORMIN HCL PO, Take 500 mg by mouth daily. , Disp: , Rfl:  .  Multiple Vitamin (MULTIVITAMIN) tablet, Take 1 tablet by mouth daily., Disp: , Rfl:  .  tamsulosin (FLOMAX) 0.4 MG CAPS capsule, Take 0.4 mg by mouth at bedtime., Disp: , Rfl:  .  warfarin (COUMADIN) 3 MG tablet, Take 3 mg by mouth one time only at 6 PM., Disp: , Rfl:  .  ondansetron (ZOFRAN) 4 MG/2ML SOLN injection, Inject 2 mLs (4 mg total) into the vein every 6  (six) hours as needed for nausea. (Patient not taking: Reported on 01/16/2016), Disp: 2 mL, Rfl: 0  Past Medical History: Past Medical History:  Diagnosis Date  . Atrial fibrillation (HCC)   . Colon polyps   . Diabetes (HCC)   . Hyperlipemia   . Hypertension     Tobacco Use: History  Smoking Status  . Former Smoker  . Quit date: 03/26/1973  Smokeless Tobacco  . Never Used    Labs: Recent Review Flowsheet Data    There is no flowsheet data to display.       Exercise Target Goals: Date: 01/16/16  Exercise Program Goal: Individual exercise prescription set with THRR, safety & activity barriers. Participant demonstrates ability to understand and report RPE using BORG scale, to self-measure pulse accurately, and to acknowledge the importance of the exercise prescription.  Exercise Prescription Goal: Starting with aerobic activity 30 plus minutes a day, 3 days per week for initial exercise prescription. Provide home exercise prescription and guidelines that participant acknowledges understanding prior to discharge.  Activity Barriers & Risk Stratification:     Activity Barriers & Cardiac Risk Stratification - 01/16/16 1223      Activity Barriers & Cardiac Risk Stratification   Activity Barriers Arthritis;Joint Problems;Back Problems;Deconditioning;Muscular Weakness;Left Knee Replacement;Decreased Ventricular Function;Shortness of Breath   Cardiac Risk Stratification High      6 Minute Walk:  6 Minute Walk    Row Name 01/16/16 1405         6 Minute Walk   Phase Initial     Distance 1170 feet     Walk Time 6 minutes     # of Rest Breaks 0     MPH 2.22     METS 2.68     RPE 13     Perceived Dyspnea  4     VO2 Peak 7.82     Symptoms Yes (comment)     Comments SOB, chronic hip pain (2/10), chronic right ankle pain (2/10)     Resting HR 75 bpm     Resting BP 116/64     Max Ex. HR 109 bpm     Max Ex. BP 146/74     2 Minute Post BP 136/74        Initial  Exercise Prescription:     Initial Exercise Prescription - 01/16/16 1400      Date of Initial Exercise RX and Referring Provider   Date 01/16/16   Referring Provider Kenneth Hooker MD     Treadmill   MPH 2   Grade 0   Minutes 15   METs 2.53     NuStep   Level 1   Minutes 15   METs 2     Biostep-RELP   Level 1   Minutes 15   METs 2     Prescription Details   Frequency (times per week) 3   Duration Progress to 45 minutes of aerobic exercise without signs/symptoms of physical distress     Intensity   THRR 40-80% of Max Heartrate 107-138   Ratings of Perceived Exertion 11-15   Perceived Dyspnea 0-4     Progression   Progression Continue to progress workloads to maintain intensity without signs/symptoms of physical distress.     Resistance Training   Training Prescription Yes   Weight 3 lbs   Reps 10-12      Perform Capillary Blood Glucose checks as needed.  Exercise Prescription Changes:      Exercise Prescription Changes    Row Name 01/16/16 1400             Exercise Review   Progression -  Walk test results         Response to Exercise   Blood Pressure (Admit) 116/64       Blood Pressure (Exercise) 146/74       Blood Pressure (Exit) 136/74       Heart Rate (Admit) 75 bpm       Heart Rate (Exercise) 109 bpm       Heart Rate (Exit) 85 bpm       Oxygen Saturation (Admit) 97 %       Oxygen Saturation (Exercise) 99 %       Rating of Perceived Exertion (Exercise) 13       Perceived Dyspnea (Exercise) 4       Symptoms SOB, chronic pain in hips and right ankle          Exercise Comments:      Exercise Comments    Row Name 01/16/16 1409           Exercise Comments Kenneth Hahn wants to get into better overall shape and lose weight.          Discharge Exercise Prescription (Final Exercise Prescription Changes):     Exercise Prescription Changes - 01/16/16 1400      Exercise  Review   Progression --  Walk test results     Response to  Exercise   Blood Pressure (Admit) 116/64   Blood Pressure (Exercise) 146/74   Blood Pressure (Exit) 136/74   Heart Rate (Admit) 75 bpm   Heart Rate (Exercise) 109 bpm   Heart Rate (Exit) 85 bpm   Oxygen Saturation (Admit) 97 %   Oxygen Saturation (Exercise) 99 %   Rating of Perceived Exertion (Exercise) 13   Perceived Dyspnea (Exercise) 4   Symptoms SOB, chronic pain in hips and right ankle      Nutrition:  Target Goals: Understanding of nutrition guidelines, daily intake of sodium 1500mg , cholesterol 200mg , calories 30% from fat and 7% or less from saturated fats, daily to have 5 or more servings of fruits and vegetables.  Biometrics:     Pre Biometrics - 01/16/16 1412      Pre Biometrics   Height 5' 9.9" (1.775 m)   Weight 289 lb 3.2 oz (131.2 kg)   Waist Circumference 49 inches   Hip Circumference 48.75 inches   Waist to Hip Ratio 1.01 %   BMI (Calculated) 41.7   Single Leg Stand 1.17 seconds       Nutrition Therapy Plan and Nutrition Goals:     Nutrition Therapy & Goals - 01/16/16 1225      Nutrition Therapy   Drug/Food Interactions Statins/Certain Fruits;Coumadin/Vit K  Jomarie LongsJoseph said he knows what to eat but he loves carbs like bread. He doesn't really like vegetables.      Nutrition Discharge: Rate Your Plate Scores:     Nutrition Assessments - 01/16/16 1225      Rate Your Plate Scores   Pre Score 48   Pre Score % 53 %      Nutrition Goals Re-Evaluation:   Psychosocial: Target Goals: Acknowledge presence or absence of depression, maximize coping skills, provide positive support system. Participant is able to verbalize types and ability to use techniques and skills needed for reducing stress and depression.  Initial Review & Psychosocial Screening:     Initial Psych Review & Screening - 01/16/16 1226      Family Dynamics   Good Support System? Yes     Screening Interventions   Interventions Encouraged to exercise      Quality of Life  Scores:     Quality of Life - 01/16/16 1228      Quality of Life Scores   Health/Function Pre 21.93 %   Socioeconomic Pre 24.25 %   Psych/Spiritual Pre 19.86 %   Family Pre 24 %   GLOBAL Pre 22.23 %      PHQ-9: Recent Review Flowsheet Data    [facility]   Decreased Interest 0   Down, Depressed, Hopeless 0   PHQ - 2 Score 0   Altered sleeping 0   Tired, decreased energy 2   Change in appetite 2   Feeling bad or failure about yourself  0   Trouble concentrating 0   Moving slowly or fidgety/restless 1   Suicidal thoughts 0   PHQ-9 Score 5   Difficult doing work/chores Not difficult at all      Psychosocial Evaluation and Intervention:   Psychosocial Re-Evaluation:   Vocational Rehabilitation: Provide vocational rehab assistance to qualifying candidates.   Vocational Rehab Evaluation & Intervention:     Vocational Rehab - 01/16/16 1224      Initial Vocational Rehab Evaluation & Intervention   Assessment shows need for Vocational Rehabilitation  No      Education: Education Goals: Education classes will be provided on a weekly basis, covering required topics. Participant will state understanding/return demonstration of topics presented.  Learning Barriers/Preferences:     Learning Barriers/Preferences - 01/16/16 1223      Learning Barriers/Preferences   Learning Barriers None   Learning Preferences Computer/Internet;Pictoral;Video      Education Topics: General Nutrition Guidelines/Fats and Fiber: -Group instruction provided by verbal, written material, models and posters to present the general guidelines for heart healthy nutrition. Gives an explanation and review of dietary fats and fiber.   Controlling Sodium/Reading Food Labels: -Group verbal and written material supporting the discussion of sodium use in heart healthy nutrition. Review and explanation with models, verbal and written materials for utilization of the food  label.   Exercise Physiology & Risk Factors: - Group verbal and written instruction with models to review the exercise physiology of the cardiovascular system and associated critical values. Details cardiovascular disease risk factors and the goals associated with each risk factor.   Aerobic Exercise & Resistance Training: - Gives group verbal and written discussion on the health impact of inactivity. On the components of aerobic and resistive training programs and the benefits of this training and how to safely progress through these programs.   Flexibility, Balance, General Exercise Guidelines: - Provides group verbal and written instruction on the benefits of flexibility and balance training programs. Provides general exercise guidelines with specific guidelines to those with heart or lung disease. Demonstration and skill practice provided.   Stress Management: - Provides group verbal and written instruction about the health risks of elevated stress, cause of high stress, and healthy ways to reduce stress.   Depression: - Provides group verbal and written instruction on the correlation between heart/lung disease and depressed mood, treatment options, and the stigmas associated with seeking treatment.   Anatomy & Physiology of the Heart: - Group verbal and written instruction and models provide basic cardiac anatomy and physiology, with the coronary electrical and arterial systems. Review of: AMI, Angina, Valve disease, Heart Failure, Cardiac Arrhythmia, Pacemakers, and the ICD.   Cardiac Procedures: - Group verbal and written instruction and models to describe the testing methods done to diagnose heart disease. Reviews the outcomes of the test results. Describes the treatment choices: Medical Management, Angioplasty, or Coronary Bypass Surgery.   Cardiac Medications: - Group verbal and written instruction to review commonly prescribed medications for heart disease. Reviews the  medication, class of the drug, and side effects. Includes the steps to properly store meds and maintain the prescription regimen.   Go Sex-Intimacy & Heart Disease, Get SMART - Goal Setting: - Group verbal and written instruction through game format to discuss heart disease and the return to sexual intimacy. Provides group verbal and written material to discuss and apply goal setting through the application of the S.M.A.R.T. Method.   Other Matters of the Heart: - Provides group verbal, written materials and models to describe Heart Failure, Angina, Valve Disease, and Diabetes in the realm of heart disease. Includes description of the disease process and treatment options available to the cardiac patient.   Exercise & Equipment Safety: - Individual verbal instruction and demonstration of equipment use and safety with use of the equipment. Flowsheet Row Cardiac Rehab from 01/16/2016 in [facility] Cardiac and Pulmonary Rehab  Date  01/16/16  Educator  C. EnterkinRN  Instruction Review Code  2- meets goals/outcomes      Infection Prevention: - Provides verbal and written material  to individual with discussion of infection control including proper hand washing and proper equipment cleaning during exercise session. Flowsheet Row Cardiac Rehab from 01/16/2016 in [facility] Cardiac and Pulmonary Rehab  Date  01/16/16  Educator  C. EnterkinRN  Instruction Review Code  2- meets goals/outcomes      Falls Prevention: - Provides verbal and written material to individual with discussion of falls prevention and safety. Flowsheet Row Cardiac Rehab from 01/16/2016 in [facility] Cardiac and Pulmonary Rehab  Date  01/16/16  Educator  C. EnterkinRN  Instruction Review Code  2- meets goals/outcomes      Diabetes: - Individual verbal and written instruction to review signs/symptoms of diabetes, desired ranges of glucose level fasting, after meals and with exercise. Advice that pre and post exercise glucose checks  will be done for 3 sessions at entry of program. Flowsheet Row Cardiac Rehab from 01/16/2016 in [facility] Cardiac and Pulmonary Rehab  Date  01/16/16  Educator  C. EnterkinRN  Instruction Review Code  2- meets goals/outcomes       Knowledge Questionnaire Score:     Knowledge Questionnaire Score - 01/16/16 1223      Knowledge Questionnaire Score   Pre Score 23/28      Core Components/Risk Factors/Patient Goals at Admission:     Personal Goals and Risk Factors at Admission - 01/16/16 1225      Core Components/Risk Factors/Patient Goals on Admission    Weight Management Yes;Obesity;Weight Loss   Intervention Weight Management: Develop a combined nutrition and exercise program designed to reach desired caloric intake, while maintaining appropriate intake of nutrient and fiber, sodium and fats, and appropriate energy expenditure required for the weight goal.;Weight Management: Provide education and appropriate resources to help participant work on and attain dietary goals.;Weight Management/Obesity: Establish reasonable short term and long term weight goals.;Obesity: Provide education and appropriate resources to help participant work on and attain dietary goals.   Admit Weight 289 lb 3.2 oz (131.2 kg)   Goal Weight: Short Term 285 lb (129.3 kg)   Goal Weight: Long Term 220 lb (99.8 kg)   Expected Outcomes Short Term: Continue to assess and modify interventions until short term weight is achieved;Long Term: Adherence to nutrition and physical activity/exercise program aimed toward attainment of established weight goal;Weight Loss: Understanding of general recommendations for a balanced deficit meal plan, which promotes 1-2 lb weight loss per week and includes a negative energy balance of [phone] kcal/d;Understanding recommendations for meals to include 15-35% energy as protein, 25-35% energy from fat, 35-60% energy from carbohydrates, less than 200mg  of dietary cholesterol, 20-35 gm of total  fiber daily;Understanding of distribution of calorie intake throughout the day with the consumption of 4-5 meals/snacks   Sedentary Yes   Intervention Provide advice, education, support and counseling about physical activity/exercise needs.;Develop an individualized exercise prescription for aerobic and resistive training based on initial evaluation findings, risk stratification, comorbidities and participant's personal goals.   Expected Outcomes Achievement of increased cardiorespiratory fitness and enhanced flexibility, muscular endurance and strength shown through measurements of functional capacity and personal statement of participant.   Increase Strength and Stamina Yes   Intervention Provide advice, education, support and counseling about physical activity/exercise needs.;Develop an individualized exercise prescription for aerobic and resistive training based on initial evaluation findings, risk stratification, comorbidities and participant's personal goals.   Expected Outcomes Achievement of increased cardiorespiratory fitness and enhanced flexibility, muscular endurance and strength shown through measurements of functional capacity and personal statement of participant.   Diabetes Yes  Intervention Provide education about signs/symptoms and action to take for hypo/hyperglycemia.;Provide education about proper nutrition, including hydration, and aerobic/resistive exercise prescription along with prescribed medications to achieve blood glucose in normal ranges: Fasting glucose 65-99 mg/dL   Expected Outcomes Short Term: Participant verbalizes understanding of the signs/symptoms and immediate care of hyper/hypoglycemia, proper foot care and importance of medication, aerobic/resistive exercise and nutrition plan for blood glucose control.;Long Term: Attainment of HbA1C < 7%.   Heart Failure Yes   Intervention Provide a combined exercise and nutrition program that is supplemented with education,  support and counseling about heart failure. Directed toward relieving symptoms such as shortness of breath, decreased exercise tolerance, and extremity edema.   Expected Outcomes Improve functional capacity of life;Short term: Attendance in program 2-3 days a week with increased exercise capacity. Reported lower sodium intake. Reported increased fruit and vegetable intake. Reports medication compliance.;Short term: Daily weights obtained and reported for increase. Utilizing diuretic protocols set by physician.;Long term: Adoption of self-care skills and reduction of barriers for early signs and symptoms recognition and intervention leading to self-care maintenance.   Hypertension Yes   Intervention Provide education on lifestyle modifcations including regular physical activity/exercise, weight management, moderate sodium restriction and increased consumption of fresh fruit, vegetables, and low fat dairy, alcohol moderation, and smoking cessation.;Monitor prescription use compliance.   Expected Outcomes Short Term: Continued assessment and intervention until BP is < 140/49mm HG in hypertensive participants. < 130/77mm HG in hypertensive participants with diabetes, heart failure or chronic kidney disease.;Long Term: Maintenance of blood pressure at goal levels.   Lipids Yes   Intervention Provide education and support for participant on nutrition & aerobic/resistive exercise along with prescribed medications to achieve LDL 70mg , HDL >40mg .   Expected Outcomes Short Term: Participant states understanding of desired cholesterol values and is compliant with medications prescribed. Participant is following exercise prescription and nutrition guidelines.;Long Term: Cholesterol controlled with medications as prescribed, with individualized exercise RX and with personalized nutrition plan. Value goals: LDL < 70mg , HDL > 40 mg.      Core Components/Risk Factors/Patient Goals Review:    Core Components/Risk  Factors/Patient Goals at Discharge (Final Review):    ITP Comments:     ITP Comments    Row Name 01/16/16 1224 01/16/16 1258         ITP Comments Daivik said he knows what to eat but he loves carbs like bread. He doesn't really like vegetables. ITP created during Cardiac Medical Review appt after the Cardiac Rehab informed consent was signed.          Comments: Ready to start Cardiac Rehab. Initial ITP

## 2016-01-16 NOTE — Progress Notes (Signed)
Daily Session Note  Patient Details  Name: Sriyan Cutting MRN: [id number] Date of Birth: 11-13-1950 Referring Provider:  Josefa Half  Encounter Date: 01/16/2016  Check In:     Session Check In - 01/16/16 1224      Check-In   Location [facility]-Cardiac & Pulmonary Rehab   Staff Present Gerlene Burdock, RN, Levie Heritage, MA, ACSM RCEP, Exercise Physiologist   Supervising physician immediately available to respond to emergencies See telemetry face sheet for immediately available ER MD   Medication changes reported     No   Fall or balance concerns reported    No   Warm-up and Cool-down Performed on first and last piece of equipment   Resistance Training Performed No   VAD Patient? No     Pain Assessment   Currently in Pain? No/denies         Goals Met:  Exercise tolerated well Personal goals reviewed  Goals Unmet:  Not Applicable  Comments:     Dr. Emily Filbert is Medical Director for [place] and [facility].

## 2016-01-18 DIAGNOSIS — Z955 Presence of coronary angioplasty implant and graft: Secondary | ICD-10-CM

## 2016-01-18 LAB — GLUCOSE, CAPILLARY
Glucose-Capillary: 206 mg/dL — ABNORMAL HIGH (ref 65–99)
Glucose-Capillary: 176 mg/dL — ABNORMAL HIGH (ref 65–99)

## 2016-01-18 NOTE — Progress Notes (Signed)
Daily Session Note  Patient Details  Name: Maxon Kresse MRN: [id number] Date of Birth: 03-Jan-1951 Referring Provider:   Flowsheet Row Cardiac Rehab from 01/16/2016 in [facility] Cardiac and Pulmonary Rehab  Referring Provider  Serafina Royals MD      Encounter Date: 01/18/2016  Check In:     Session Check In - 01/18/16 1752      Check-In   Location [facility]-Cardiac & Pulmonary Rehab   Staff Present Heath Lark, RN, BSN, CCRP;Carroll Enterkin, RN, Vickki Hearing, BA, ACSM CEP, Exercise Physiologist   Supervising physician immediately available to respond to emergencies See telemetry face sheet for immediately available ER MD   Medication changes reported     No   Fall or balance concerns reported    No   Warm-up and Cool-down Performed on first and last piece of equipment   Resistance Training Performed Yes   VAD Patient? No     Pain Assessment   Currently in Pain? No/denies         Goals Met:  Independence with exercise equipment Exercise tolerated well No report of cardiac concerns or symptoms Strength training completed today  Goals Unmet:  Not Applicable  Comments: First full day of exercise!  Patient was oriented to gym and equipment including functions, settings, policies, and procedures.  Patient's individual exercise prescription and treatment plan were reviewed.  All starting workloads were established based on the results of the 6 minute walk test done at initial orientation visit.  The plan for exercise progression was also introduced and progression will be customized based on patient's performance and goals.    Dr. Emily Filbert is Medical Director for [place] and [facility].

## 2016-01-19 ENCOUNTER — Encounter: Payer: Medicare Other | Admitting: *Deleted

## 2016-01-19 DIAGNOSIS — Z955 Presence of coronary angioplasty implant and graft: Secondary | ICD-10-CM

## 2016-01-19 LAB — GLUCOSE, CAPILLARY
GLUCOSE-CAPILLARY: 177 mg/dL — AB (ref 65–99)
Glucose-Capillary: 155 mg/dL — ABNORMAL HIGH (ref 65–99)

## 2016-01-19 NOTE — Progress Notes (Signed)
Daily Session Note  Patient Details  Name: Taiwo Fish MRN: [id number] Date of Birth: [birth year] Referring Provider:   Flowsheet Row Cardiac Rehab from 01/16/2016 in [facility] Cardiac and Pulmonary Rehab  Referring Provider  Serafina Royals MD      Encounter Date: 01/19/2016  Check In:     Session Check In - 01/19/16 1710      Check-In   Location [facility]-Cardiac & Pulmonary Rehab   Staff Present Gerlene Burdock, RN, Moises Blood, BS, ACSM CEP, Exercise Physiologist;Amanda Oletta Darter, BA, ACSM CEP, Exercise Physiologist   Supervising physician immediately available to respond to emergencies See telemetry face sheet for immediately available ER MD   Medication changes reported     No   Fall or balance concerns reported    No   Warm-up and Cool-down Performed on first and last piece of equipment   Resistance Training Performed No  Stretching only: left early for dietician appointmet   VAD Patient? No     Pain Assessment   Currently in Pain? No/denies   Multiple Pain Sites No         Goals Met:  Independence with exercise equipment Exercise tolerated well No report of cardiac concerns or symptoms Strength training completed today  Goals Unmet:  Not Applicable  Comments: Pt able to follow exercise prescription today without complaint.  Will continue to monitor for progression.    Dr. Emily Filbert is Medical Director for [place] and [facility].

## 2016-01-23 ENCOUNTER — Encounter: Payer: Medicare Other | Admitting: *Deleted

## 2016-01-23 DIAGNOSIS — Z955 Presence of coronary angioplasty implant and graft: Secondary | ICD-10-CM

## 2016-01-23 LAB — GLUCOSE, CAPILLARY: Glucose-Capillary: 222 mg/dL — ABNORMAL HIGH (ref 65–99)

## 2016-01-23 NOTE — Progress Notes (Signed)
Daily Session Note  Patient Details  Name: Kenneth Hahn MRN: [id number] Date of Birth: May 29, 1950 Referring Provider:   Flowsheet Row Cardiac Rehab from 01/16/2016 in [facility] Cardiac and Pulmonary Rehab  Referring Provider  Serafina Royals MD      Encounter Date: 01/23/2016  Check In:     Session Check In - 01/23/16 1811      Check-In   Staff Present Heath Lark, RN, BSN, CCRP;Carroll Enterkin, RN, Moises Blood, BS, ACSM CEP, Exercise Physiologist   Supervising physician immediately available to respond to emergencies See telemetry face sheet for immediately available ER MD   Medication changes reported     No   Fall or balance concerns reported    No   Warm-up and Cool-down Performed on first and last piece of equipment   Resistance Training Performed Yes   VAD Patient? No     Pain Assessment   Currently in Pain? No/denies         Goals Met:  Exercise tolerated well No report of cardiac concerns or symptoms Strength training completed today  Goals Unmet:  Not Applicable  Comments: Doing well with exercise prescription progression.    Dr. Emily Filbert is Medical Director for [place] and [facility].

## 2016-01-25 ENCOUNTER — Encounter: Payer: Medicare Other | Attending: Cardiology

## 2016-01-25 DIAGNOSIS — Z955 Presence of coronary angioplasty implant and graft: Secondary | ICD-10-CM | POA: Diagnosis not present

## 2016-01-25 NOTE — Progress Notes (Signed)
Daily Session Note  Patient Details  Name: Kenneth Hahn MRN: [id number] Date of Birth: 1951-01-02 Referring Provider:   Flowsheet Row Cardiac Rehab from 01/16/2016 in [facility] Cardiac and Pulmonary Rehab  Referring Provider  Serafina Royals MD      Encounter Date: 01/25/2016  Check In:     Session Check In - 01/25/16 1754      Check-In   Location [facility]-Cardiac & Pulmonary Rehab   Staff Present Jeanell Sparrow, DPT, Burlene Arnt, BA, ACSM CEP, Exercise Physiologist;Other   Supervising physician immediately available to respond to emergencies See telemetry face sheet for immediately available ER MD   Medication changes reported     No   Fall or balance concerns reported    No   Warm-up and Cool-down Performed on first and last piece of equipment   Resistance Training Performed Yes   VAD Patient? No     Pain Assessment   Currently in Pain? No/denies           Exercise Prescription Changes - 01/25/16 1300      Response to Exercise   Blood Pressure (Admit) 140/68   Blood Pressure (Exercise) 162/88   Blood Pressure (Exit) 132/60   Heart Rate (Admit) 88 bpm   Heart Rate (Exercise) 125 bpm   Heart Rate (Exit) 97 bpm   Rating of Perceived Exertion (Exercise) 11   Symptoms none   Duration Progress to 45 minutes of aerobic exercise without signs/symptoms of physical distress   Intensity THRR unchanged     Progression   Progression Continue to progress workloads to maintain intensity without signs/symptoms of physical distress.     Resistance Training   Training Prescription Yes   Weight 3   Reps 10-12     Interval Training   Interval Training No     Treadmill   MPH 2   Grade 0   Minutes 15   METs 2.53     NuStep   Level 1   Minutes 15   METs 2.1      Goals Met:  Independence with exercise equipment Exercise tolerated well No report of cardiac concerns or symptoms Strength training completed today  Goals Unmet:  Not Applicable  Comments:  Pt able to follow exercise prescription today without complaint.  Will continue to monitor for progression.    Dr. Emily Filbert is Medical Director for [place] and [facility].

## 2016-01-26 ENCOUNTER — Encounter: Payer: Medicare Other | Admitting: *Deleted

## 2016-01-26 DIAGNOSIS — Z955 Presence of coronary angioplasty implant and graft: Secondary | ICD-10-CM

## 2016-01-26 NOTE — Progress Notes (Signed)
Daily Session Note  Patient Details  Name: Kenneth Hahn MRN: [id number] Date of Birth: 1951/02/22 Referring Provider:   Flowsheet Row Cardiac Rehab from 01/16/2016 in [facility] Cardiac and Pulmonary Rehab  Referring Provider  Serafina Royals MD      Encounter Date: 01/26/2016  Check In:     Session Check In - 01/26/16 1715      Check-In   Location [facility]-Cardiac & Pulmonary Rehab   Staff Present Heath Lark, RN, BSN, Laveda Norman, BS, ACSM CEP, Exercise Physiologist;Amanda Oletta Darter, BA, ACSM CEP, Exercise Physiologist;Rebecca Brayton El, DPT, CEEA   Supervising physician immediately available to respond to emergencies See telemetry face sheet for immediately available ER MD   Medication changes reported     No   Fall or balance concerns reported    No   Warm-up and Cool-down Performed on first and last piece of equipment   Resistance Training Performed Yes   VAD Patient? No     Pain Assessment   Currently in Pain? No/denies   Multiple Pain Sites No         Goals Met:  Independence with exercise equipment Exercise tolerated well Personal goals reviewed No report of cardiac concerns or symptoms Strength training completed today  Goals Unmet:  Not Applicable  Comments: Pt able to follow exercise prescription today without complaint.  Will continue to monitor for progression.    Dr. Emily Filbert is Medical Director for [place] and [facility].

## 2016-01-30 ENCOUNTER — Encounter: Payer: Medicare Other | Admitting: *Deleted

## 2016-01-30 DIAGNOSIS — Z955 Presence of coronary angioplasty implant and graft: Secondary | ICD-10-CM

## 2016-01-30 NOTE — Progress Notes (Signed)
Daily Session Note  Patient Details  Name: Juniper Snyders MRN: [id number] Date of Birth: Dec 05, 1950 Referring Provider:   Flowsheet Row Cardiac Rehab from 01/16/2016 in [facility] Cardiac and Pulmonary Rehab  Referring Provider  Serafina Royals MD      Encounter Date: 01/30/2016  Check In:     Session Check In - 01/30/16 1746      Check-In   Staff Present Heath Lark, RN, BSN, CCRP;Carroll Enterkin, RN, Moises Blood, BS, ACSM CEP, Exercise Physiologist   Supervising physician immediately available to respond to emergencies See telemetry face sheet for immediately available ER MD   Medication changes reported     No   Fall or balance concerns reported    No   Warm-up and Cool-down Performed on first and last piece of equipment   Resistance Training Performed Yes   VAD Patient? No     Pain Assessment   Currently in Pain? No/denies         Goals Met:  Exercise tolerated well No report of cardiac concerns or symptoms Strength training completed today  Goals Unmet:  Not Applicable  Comments: Doing well with exercise prescription progression.    Dr. Emily Filbert is Medical Director for [place] and [facility].

## 2016-02-01 ENCOUNTER — Encounter: Payer: Self-pay | Admitting: *Deleted

## 2016-02-01 DIAGNOSIS — Z955 Presence of coronary angioplasty implant and graft: Secondary | ICD-10-CM

## 2016-02-01 NOTE — Progress Notes (Signed)
Daily Session Note  Patient Details  Name: Metro [place] MRN: [id number] Date of Birth: 1950-08-01 Referring Provider:   Flowsheet Row Cardiac Rehab from 01/16/2016 in [facility] Cardiac and Pulmonary Rehab  Referring Provider  Serafina Royals MD      Encounter Date: 02/01/2016  Check In:      Goals Met:  Independence with exercise equipment  Goals Unmet:  Not Applicable  Comments: Patient completed exercise prescription and all exercise goals during rehab session. The exercise was tolerated well and the patient is progressing in the program.    Dr. Emily Filbert is Medical Director for [place] and [facility].

## 2016-02-01 NOTE — Progress Notes (Signed)
Cardiac Individual Treatment Plan  Patient Details  Name: Kenneth Hahn MRN: [id number] Date of Birth: May 14, 1950 Referring Provider:   Flowsheet Row Cardiac Rehab from 01/16/2016 in [facility]  Referring Provider  Serafina Royals MD      Initial Encounter Date:  Flowsheet Row Cardiac Rehab from 01/16/2016 in [facility] Cardiac and Pulmonary Rehab  Date  01/16/16  Referring Provider  Serafina Royals MD      Visit Diagnosis: Status post coronary artery stent placement  Patient's Home Medications on Admission:  Current Outpatient Prescriptions:  .  amLODipine-benazepril (LOTREL) 5-20 MG per capsule, Take 1 capsule by mouth daily., Disp: , Rfl:  .  atenolol (TENORMIN) 25 MG tablet, Take 25 mg by mouth 2 (two) times daily. Take one tablet morning and evening, Disp: , Rfl:  .  atorvastatin (LIPITOR) 80 MG tablet, Take 1 tablet (80 mg total) by mouth daily at 6 PM., Disp: 90 tablet, Rfl: 4 .  Cholecalciferol (VITAMIN D-3) 5000 UNITS TABS, Take 1 tablet by mouth daily., Disp: , Rfl:  .  clopidogrel (PLAVIX) 75 MG tablet, Take 1 tablet (75 mg total) by mouth daily with breakfast., Disp: 90 tablet, Rfl: 4 .  clopidogrel (PLAVIX) 75 MG tablet, Take 1 tablet (75 mg total) by mouth daily., Disp: 30 tablet, Rfl: 5 .  furosemide (LASIX) 20 MG tablet, Take 20 mg by mouth daily., Disp: , Rfl:  .  insulin glargine (LANTUS) 100 UNIT/ML injection, Inject 0.62 mLs (62 Units total) into the skin at bedtime., Disp: 10 mL, Rfl: 11 .  METFORMIN HCL PO, Take 500 mg by mouth daily. , Disp: , Rfl:  .  Multiple Vitamin (MULTIVITAMIN) tablet, Take 1 tablet by mouth daily., Disp: , Rfl:  .  ondansetron (ZOFRAN) 4 MG/2ML SOLN injection, Inject 2 mLs (4 mg total) into the vein every 6 (six) hours as needed for nausea. (Patient not taking: Reported on 01/16/2016), Disp: 2 mL, Rfl: 0 .  tamsulosin (FLOMAX) 0.4 MG CAPS capsule, Take 0.4 mg by mouth at bedtime., Disp: , Rfl:  .  warfarin  (COUMADIN) 3 MG tablet, Take 3 mg by mouth one time only at 6 PM., Disp: , Rfl:   Past Medical History: Past Medical History:  Diagnosis Date  . Atrial fibrillation ([place])   . Colon polyps   . Diabetes ([place])   . Hyperlipemia   . Hypertension     Tobacco Use: History  Smoking Status  . Former Smoker  . Quit date: 03/26/1973  Smokeless Tobacco  . Never Used    Labs: Recent Review Flowsheet Data    There is no flowsheet data to display.       Exercise Target Goals:    Exercise Program Goal: Individual exercise prescription set with THRR, safety & activity barriers. Participant demonstrates ability to understand and report RPE using BORG scale, to self-measure pulse accurately, and to acknowledge the importance of the exercise prescription.  Exercise Prescription Goal: Starting with aerobic activity 30 plus minutes a day, 3 days per week for initial exercise prescription. Provide home exercise prescription and guidelines that participant acknowledges understanding prior to discharge.  Activity Barriers & Risk Stratification:     Activity Barriers & Cardiac Risk Stratification - 01/16/16 1223      Activity Barriers & Cardiac Risk Stratification   Activity Barriers Arthritis;Joint Problems;Back Problems;Deconditioning;Muscular Weakness;Left Knee Replacement;Decreased Ventricular Function;Shortness of Breath   Cardiac Risk Stratification High      6 Minute Walk:  6 Minute Walk    Row Name 01/16/16 1405         6 Minute Walk   Phase Initial     Distance 1170 feet     Walk Time 6 minutes     # of Rest Breaks 0     MPH 2.22     METS 2.68     RPE 13     Perceived Dyspnea  4     VO2 Peak 7.82     Symptoms Yes (comment)     Comments SOB, chronic hip pain (2/10), chronic right ankle pain (2/10)     Resting HR 75 bpm     Resting BP 116/64     Max Ex. HR 109 bpm     Max Ex. BP 146/74     2 Minute Post BP 136/74        Initial Exercise Prescription:      Initial Exercise Prescription - 01/16/16 1400      Date of Initial Exercise RX and Referring Provider   Date 01/16/16   Referring Provider Serafina Royals MD     Treadmill   MPH 2   Grade 0   Minutes 15   METs 2.53     NuStep   Level 1   Minutes 15   METs 2     Biostep-RELP   Level 1   Minutes 15   METs 2     Prescription Details   Frequency (times per week) 3   Duration Progress to 45 minutes of aerobic exercise without signs/symptoms of physical distress     Intensity   THRR 40-80% of Max Heartrate 107-138   Ratings of Perceived Exertion 11-15   Perceived Dyspnea 0-4     Progression   Progression Continue to progress workloads to maintain intensity without signs/symptoms of physical distress.     Resistance Training   Training Prescription Yes   Weight 3 lbs   Reps 10-12      Perform Capillary Blood Glucose checks as needed.  Exercise Prescription Changes:     Exercise Prescription Changes    Row Name 01/16/16 1400 01/25/16 1300           Exercise Review   Progression -  Walk test results  -        Response to Exercise   Blood Pressure (Admit) 116/64 140/68      Blood Pressure (Exercise) 146/74 162/88      Blood Pressure (Exit) 136/74 132/60      Heart Rate (Admit) 75 bpm 88 bpm      Heart Rate (Exercise) 109 bpm 125 bpm      Heart Rate (Exit) 85 bpm 97 bpm      Oxygen Saturation (Admit) 97 %  -      Oxygen Saturation (Exercise) 99 %  -      Rating of Perceived Exertion (Exercise) 13 11      Perceived Dyspnea (Exercise) 4  -      Symptoms SOB, chronic pain in hips and right ankle none      Duration  - Progress to 45 minutes of aerobic exercise without signs/symptoms of physical distress      Intensity  - THRR unchanged        Progression   Progression  - Continue to progress workloads to maintain intensity without signs/symptoms of physical distress.        Resistance Training   Training Prescription  - Yes  Weight  - 3      Reps  -  10-12        Interval Training   Interval Training  - No        Treadmill   MPH  - 2      Grade  - 0      Minutes  - 15      METs  - 2.53        NuStep   Level  - 1      Minutes  - 15      METs  - 2.1         Exercise Comments:     Exercise Comments    Row Name 01/16/16 1409 01/25/16 1401         Exercise Comments Gerritt wants to get into better overall shape and lose weight. Curren did very well in his first week of exercise.         Discharge Exercise Prescription (Final Exercise Prescription Changes):     Exercise Prescription Changes - 01/25/16 1300      Response to Exercise   Blood Pressure (Admit) 140/68   Blood Pressure (Exercise) 162/88   Blood Pressure (Exit) 132/60   Heart Rate (Admit) 88 bpm   Heart Rate (Exercise) 125 bpm   Heart Rate (Exit) 97 bpm   Rating of Perceived Exertion (Exercise) 11   Symptoms none   Duration Progress to 45 minutes of aerobic exercise without signs/symptoms of physical distress   Intensity THRR unchanged     Progression   Progression Continue to progress workloads to maintain intensity without signs/symptoms of physical distress.     Resistance Training   Training Prescription Yes   Weight 3   Reps 10-12     Interval Training   Interval Training No     Treadmill   MPH 2   Grade 0   Minutes 15   METs 2.53     NuStep   Level 1   Minutes 15   METs 2.1      Nutrition:  Target Goals: Understanding of nutrition guidelines, daily intake of sodium <1554m, cholesterol <2054m calories 30% from fat and 7% or less from saturated fats, daily to have 5 or more servings of fruits and vegetables.  Biometrics:     Pre Biometrics - 01/16/16 1412      Pre Biometrics   Height 5' 9.9" (1.775 m)   Weight 289 lb 3.2 oz (131.2 kg)   Waist Circumference 49 inches   Hip Circumference 48.75 inches   Waist to Hip Ratio 1.01 %   BMI (Calculated) 41.7   Single Leg Stand 1.17 seconds       Nutrition Therapy Plan and  Nutrition Goals:     Nutrition Therapy & Goals - 01/20/16 1451      Nutrition Therapy   Diet 1700kcal diabetes diet with DASH diet guidelines   Drug/Food Interactions Statins/Certain Fruits;Coumadin/Vit K   Protein (specify units) 8oz   Fiber 30 grams   Whole Grain Foods 3 servings   Saturated Fats 14 max. grams   Fruits and Vegetables 5 servings/day   Sodium 1500 grams     Personal Nutrition Goals   Personal Goal #1 Work on portion control -- especially starches, meats -- eat generous portions of vegetables.   Personal Goal #2 Try eating slower by drinking water during meals, taking small bites, and/or chewing thoroughly.   Personal Goal #3 Continue with regular exercise  Intervention Plan   Intervention Prescribe, educate and counsel regarding individualized specific dietary modifications aiming towards targeted core components such as weight, hypertension, lipid management, diabetes, heart failure and other comorbidities.;Nutrition handout(s) given to patient.   Expected Outcomes Short Term Goal: Understand basic principles of dietary content, such as calories, fat, sodium, cholesterol and nutrients.;Short Term Goal: A plan has been developed with personal nutrition goals set during dietitian appointment.;Long Term Goal: Adherence to prescribed nutrition plan.      Nutrition Discharge: Rate Your Plate Scores:     Nutrition Assessments - 01/16/16 1225      Rate Your Plate Scores   Pre Score 48   Pre Score % 53 %      Nutrition Goals Re-Evaluation:   Psychosocial: Target Goals: Acknowledge presence or absence of depression, maximize coping skills, provide positive support system. Participant is able to verbalize types and ability to use techniques and skills needed for reducing stress and depression.  Initial Review & Psychosocial Screening:     Initial Psych Review & Screening - 01/16/16 1226      Family Dynamics   Good Support System? Yes     Screening  Interventions   Interventions Encouraged to exercise      Quality of Life Scores:     Quality of Life - 01/16/16 1228      Quality of Life Scores   Health/Function Pre 21.93 %   Socioeconomic Pre 24.25 %   Psych/Spiritual Pre 19.86 %   Family Pre 24 %   GLOBAL Pre 22.23 %      PHQ-9: Recent Review Flowsheet Data    Depression screen [facility] 2/9 01/16/2016   Decreased Interest 0   Down, Depressed, Hopeless 0   PHQ - 2 Score 0   Altered sleeping 0   Tired, decreased energy 2   Change in appetite 2   Feeling bad or failure about yourself  0   Trouble concentrating 0   Moving slowly or fidgety/restless 1   Suicidal thoughts 0   PHQ-9 Score 5   Difficult doing work/chores Not difficult at all      Psychosocial Evaluation and Intervention:     Psychosocial Evaluation - 01/23/16 1710      Psychosocial Evaluation & Interventions   Interventions Encouraged to exercise with the program and follow exercise prescription   Comments Counselor met with Mr. Chauncey Cruel today for initial psychosocial evaluation.  He will be 65 years old next week and had a stent inserted several weeks ago.  He is married and his wife's adult children live close by.  Mr. Chauncey Cruel has adult children in the [place] area.  His first wife passed in 2008.  Mr. Chauncey Cruel had a knee replacement in 2016 and he currently has diabetes as well as heart problems.  He reports sleeping "too much," although he is up and down urinating frequently in the night - he is in the bed for 12-14 hours typically.  Mr. Chauncey Cruel states he is going to check with the Dr. about this being a possible medication reaction to one of his heart meds.  His appetite has decreased over the past several years; although he continues to contend with being overweight.  He denies a history or current symptoms of depression or anxiety.  He states his mood is typically positive and is even more positive when he isn't feeling so tired.  Mr. Chauncey Cruel reports he has minimal stress other than his  health currently.  He has goals to increase his  stamina and strength and have more energy while in this class.  Counselor encouraged him to check with his Dr. soon about the medications to see if this will improve his energy levels and decrease the amount of time he is in the bed each day.  Staff will continue to follow with Mr. Chauncey Cruel throughout the course of this program.          Psychosocial Re-Evaluation:   Vocational Rehabilitation: Provide vocational rehab assistance to qualifying candidates.   Vocational Rehab Evaluation & Intervention:     Vocational Rehab - 01/16/16 1224      Initial Vocational Rehab Evaluation & Intervention   Assessment shows need for Vocational Rehabilitation No      Education: Education Goals: Education classes will be provided on a weekly basis, covering required topics. Participant will state understanding/return demonstration of topics presented.  Learning Barriers/Preferences:     Learning Barriers/Preferences - 01/16/16 1223      Learning Barriers/Preferences   Learning Barriers None   Learning Preferences Computer/Internet;Pictoral;Video      Education Topics: General Nutrition Guidelines/Fats and Fiber: -Group instruction provided by verbal, written material, models and posters to present the general guidelines for heart healthy nutrition. Gives an explanation and review of dietary fats and fiber.   Controlling Sodium/Reading Food Labels: -Group verbal and written material supporting the discussion of sodium use in heart healthy nutrition. Review and explanation with models, verbal and written materials for utilization of the food label.   Exercise Physiology & Risk Factors: - Group verbal and written instruction with models to review the exercise physiology of the cardiovascular system and associated critical values. Details cardiovascular disease risk factors and the goals associated with each risk factor.   Aerobic Exercise &  Resistance Training: - Gives group verbal and written discussion on the health impact of inactivity. On the components of aerobic and resistive training programs and the benefits of this training and how to safely progress through these programs.   Flexibility, Balance, General Exercise Guidelines: - Provides group verbal and written instruction on the benefits of flexibility and balance training programs. Provides general exercise guidelines with specific guidelines to those with heart or lung disease. Demonstration and skill practice provided.   Stress Management: - Provides group verbal and written instruction about the health risks of elevated stress, cause of high stress, and healthy ways to reduce stress.   Depression: - Provides group verbal and written instruction on the correlation between heart/lung disease and depressed mood, treatment options, and the stigmas associated with seeking treatment.   Anatomy & Physiology of the Heart: - Group verbal and written instruction and models provide basic cardiac anatomy and physiology, with the coronary electrical and arterial systems. Review of: AMI, Angina, Valve disease, Heart Failure, Cardiac Arrhythmia, Pacemakers, and the ICD.   Cardiac Procedures: - Group verbal and written instruction and models to describe the testing methods done to diagnose heart disease. Reviews the outcomes of the test results. Describes the treatment choices: Medical Management, Angioplasty, or Coronary Bypass Surgery.   Cardiac Medications: - Group verbal and written instruction to review commonly prescribed medications for heart disease. Reviews the medication, class of the drug, and side effects. Includes the steps to properly store meds and maintain the prescription regimen. Flowsheet Row Cardiac Rehab from 01/30/2016 in [facility] Cardiac and Pulmonary Rehab  Date  01/23/16 [part1]  Educator  SB  Instruction Review Code  2- meets goals/outcomes      Go  Sex-Intimacy & Heart Disease, Get  SMART - Goal Setting: - Group verbal and written instruction through game format to discuss heart disease and the return to sexual intimacy. Provides group verbal and written material to discuss and apply goal setting through the application of the S.M.A.R.T. Method.   Other Matters of the Heart: - Provides group verbal, written materials and models to describe Heart Failure, Angina, Valve Disease, and Diabetes in the realm of heart disease. Includes description of the disease process and treatment options available to the cardiac patient.   Exercise & Equipment Safety: - Individual verbal instruction and demonstration of equipment use and safety with use of the equipment. Flowsheet Row Cardiac Rehab from 01/30/2016 in [facility] Cardiac and Pulmonary Rehab  Date  01/16/16  Educator  C. EnterkinRN  Instruction Review Code  2- meets goals/outcomes      Infection Prevention: - Provides verbal and written material to individual with discussion of infection control including proper hand washing and proper equipment cleaning during exercise session. Flowsheet Row Cardiac Rehab from 01/30/2016 in [facility] Cardiac and Pulmonary Rehab  Date  01/16/16  Educator  C. EnterkinRN  Instruction Review Code  2- meets goals/outcomes      Falls Prevention: - Provides verbal and written material to individual with discussion of falls prevention and safety. Flowsheet Row Cardiac Rehab from 01/30/2016 in [facility] Cardiac and Pulmonary Rehab  Date  01/16/16  Educator  C. [place]  Instruction Review Code  2- meets goals/outcomes      Diabetes: - Individual verbal and written instruction to review signs/symptoms of diabetes, desired ranges of glucose level fasting, after meals and with exercise. Advice that pre and post exercise glucose checks will be done for 3 sessions at entry of program. Tome from 01/30/2016 in [facility] Cardiac and Pulmonary Rehab  Date   01/16/16  Educator  C. [place]  Instruction Review Code  2- meets goals/outcomes       Knowledge Questionnaire Score:     Knowledge Questionnaire Score - 01/16/16 1223      Knowledge Questionnaire Score   Pre Score 23/28      Core Components/Risk Factors/Patient Goals at Admission:     Personal Goals and Risk Factors at Admission - 01/16/16 1225      Core Components/Risk Factors/Patient Goals on Admission    Weight Management Yes;Obesity;Weight Loss   Intervention Weight Management: Develop a combined nutrition and exercise program designed to reach desired caloric intake, while maintaining appropriate intake of nutrient and fiber, sodium and fats, and appropriate energy expenditure required for the weight goal.;Weight Management: Provide education and appropriate resources to help participant work on and attain dietary goals.;Weight Management/Obesity: Establish reasonable short term and long term weight goals.;Obesity: Provide education and appropriate resources to help participant work on and attain dietary goals.   Admit Weight 289 lb 3.2 oz (131.2 kg)   Goal Weight: Short Term 285 lb (129.3 kg)   Goal Weight: Long Term 220 lb (99.8 kg)   Expected Outcomes Short Term: Continue to assess and modify interventions until short term weight is achieved;Long Term: Adherence to nutrition and physical activity/exercise program aimed toward attainment of established weight goal;Weight Loss: Understanding of general recommendations for a balanced deficit meal plan, which promotes 1-2 lb weight loss per week and includes a negative energy balance of [id number] kcal/d;Understanding recommendations for meals to include 15-35% energy as protein, 25-35% energy from fat, 35-60% energy from carbohydrates, less than 238m of dietary cholesterol, 20-35 gm of total fiber daily;Understanding of distribution of calorie  intake throughout the day with the consumption of 4-5 meals/snacks   Sedentary Yes    Intervention Provide advice, education, support and counseling about physical activity/exercise needs.;Develop an individualized exercise prescription for aerobic and resistive training based on initial evaluation findings, risk stratification, comorbidities and participant's personal goals.   Expected Outcomes Achievement of increased cardiorespiratory fitness and enhanced flexibility, muscular endurance and strength shown through measurements of functional capacity and personal statement of participant.   Increase Strength and Stamina Yes   Intervention Provide advice, education, support and counseling about physical activity/exercise needs.;Develop an individualized exercise prescription for aerobic and resistive training based on initial evaluation findings, risk stratification, comorbidities and participant's personal goals.   Expected Outcomes Achievement of increased cardiorespiratory fitness and enhanced flexibility, muscular endurance and strength shown through measurements of functional capacity and personal statement of participant.   Diabetes Yes   Intervention Provide education about signs/symptoms and action to take for hypo/hyperglycemia.;Provide education about proper nutrition, including hydration, and aerobic/resistive exercise prescription along with prescribed medications to achieve blood glucose in normal ranges: Fasting glucose 65-99 mg/dL   Expected Outcomes Short Term: Participant verbalizes understanding of the signs/symptoms and immediate care of hyper/hypoglycemia, proper foot care and importance of medication, aerobic/resistive exercise and nutrition plan for blood glucose control.;Long Term: Attainment of HbA1C < 7%.   Heart Failure Yes   Intervention Provide a combined exercise and nutrition program that is supplemented with education, support and counseling about heart failure. Directed toward relieving symptoms such as shortness of breath, decreased exercise tolerance, and  extremity edema.   Expected Outcomes Improve functional capacity of life;Short term: Attendance in program 2-3 days a week with increased exercise capacity. Reported lower sodium intake. Reported increased fruit and vegetable intake. Reports medication compliance.;Short term: Daily weights obtained and reported for increase. Utilizing diuretic protocols set by physician.;Long term: Adoption of self-care skills and reduction of barriers for early signs and symptoms recognition and intervention leading to self-care maintenance.   Hypertension Yes   Intervention Provide education on lifestyle modifcations including regular physical activity/exercise, weight management, moderate sodium restriction and increased consumption of fresh fruit, vegetables, and low fat dairy, alcohol moderation, and smoking cessation.;Monitor prescription use compliance.   Expected Outcomes Short Term: Continued assessment and intervention until BP is < 140/72m HG in hypertensive participants. < 130/831mHG in hypertensive participants with diabetes, heart failure or chronic kidney disease.;Long Term: Maintenance of blood pressure at goal levels.   Lipids Yes   Intervention Provide education and support for participant on nutrition & aerobic/resistive exercise along with prescribed medications to achieve LDL <7073mHDL >49m33m Expected Outcomes Short Term: Participant states understanding of desired cholesterol values and is compliant with medications prescribed. Participant is following exercise prescription and nutrition guidelines.;Long Term: Cholesterol controlled with medications as prescribed, with individualized exercise RX and with personalized nutrition plan. Value goals: LDL < 70mg8mL > 40 mg.      Core Components/Risk Factors/Patient Goals Review:      Goals and Risk Factor Review    Row Name 01/27/16 1416             Core Components/Risk Factors/Patient Goals Review   Personal Goals Review Weight  Management/Obesity;Increase Strength and Stamina;Lipids;Hypertension;Diabetes       Review Joes weight is currently 286.  he has met with the dietician and confirmed his knowledge of ways to manage his DM.  He is also watching his food intake.    He is exercising 3 days per week and  reports feeling better after exercise.  HTN and cholesterol are the same and he is taking meds as directed.        Expected Outcomes Joe will continue to praactice healthier habits with diet and exercise and see an overall improvement in his health.           Core Components/Risk Factors/Patient Goals at Discharge (Final Review):      Goals and Risk Factor Review - 01/27/16 1416      Core Components/Risk Factors/Patient Goals Review   Personal Goals Review Weight Management/Obesity;Increase Strength and Stamina;Lipids;Hypertension;Diabetes   Review Joes weight is currently 286.  he has met with the dietician and confirmed his knowledge of ways to manage his DM.  He is also watching his food intake.    He is exercising 3 days per week and reports feeling better after exercise.  HTN and cholesterol are the same and he is taking meds as directed.    Expected Outcomes Joe will continue to praactice healthier habits with diet and exercise and see an overall improvement in his health.       ITP Comments:     ITP Comments    Row Name 01/16/16 1224 01/16/16 1258 02/01/16 0646       ITP Comments Zai said he knows what to eat but he loves carbs like bread. He doesn't really like vegetables. ITP created during Cardiac Medical Review appt after the Cardiac Rehab informed consent was signed.  30 day review completed for Medical Director physician review and signature. Continue ITP unless changes made by physician.        Comments:

## 2016-02-02 ENCOUNTER — Encounter: Payer: Medicare Other | Admitting: *Deleted

## 2016-02-02 DIAGNOSIS — Z955 Presence of coronary angioplasty implant and graft: Secondary | ICD-10-CM

## 2016-02-02 NOTE — Progress Notes (Signed)
Daily Session Note  Patient Details  Name: [name] MRN: [id number] Date of Birth: Aug 07, 1950 Referring Provider:   Flowsheet Row Cardiac Rehab from 01/16/2016 in [facility] Cardiac and Pulmonary Rehab  Referring Provider  Serafina Royals MD      Encounter Date: 02/02/2016  Check In:     Session Check In - 02/02/16 1714      Check-In   Location [facility]-Cardiac & Pulmonary Rehab   Staff Present Heath Lark, RN, BSN, Laveda Norman, BS, ACSM CEP, Exercise Physiologist;Amanda Oletta Darter, [place], ACSM CEP, Exercise Physiologist   Supervising physician immediately available to respond to emergencies See telemetry face sheet for immediately available ER MD   Medication changes reported     No   Fall or balance concerns reported    No   Warm-up and Cool-down Performed on first and last piece of equipment   Resistance Training Performed Yes   VAD Patient? No     Pain Assessment   Currently in Pain? No/denies   Multiple Pain Sites No         Goals Met:  Independence with exercise equipment Exercise tolerated well No report of cardiac concerns or symptoms Strength training completed today  Goals Unmet:  Not Applicable  Comments: Pt able to follow exercise prescription today without complaint.  Will continue to monitor for progression.    Dr. Emily Filbert is Medical Director for [place] and [facility].

## 2016-02-06 ENCOUNTER — Encounter: Payer: Medicare Other | Admitting: *Deleted

## 2016-02-06 DIAGNOSIS — Z955 Presence of coronary angioplasty implant and graft: Secondary | ICD-10-CM | POA: Diagnosis not present

## 2016-02-06 NOTE — Progress Notes (Signed)
Daily Session Note  Patient Details  Name: Kenneth Hahn MRN: [id number] Date of Birth: 12/04/1950 Referring Provider:   Flowsheet Row Cardiac Rehab from 01/16/2016 in [facility] Cardiac and Pulmonary Rehab  Referring Provider  Kowalski, Bruce MD      Encounter Date: 02/06/2016  Check In:     Session Check In - 02/06/16 1638      Check-In   Location [facility]-Cardiac & Pulmonary Rehab   Staff Present Carroll Enterkin, RN, BSN;Susanne Bice, RN, BSN, CCRP;Kelly Hayes, BS, ACSM CEP, Exercise Physiologist   Supervising physician immediately available to respond to emergencies See telemetry face sheet for immediately available ER MD   Medication changes reported     No   Fall or balance concerns reported    No   Warm-up and Cool-down Performed on first and last piece of equipment   Resistance Training Performed Yes   VAD Patient? No     Pain Assessment   Currently in Pain? No/denies   Multiple Pain Sites No         Goals Met:  Independence with exercise equipment Exercise tolerated well No report of cardiac concerns or symptoms Strength training completed today  Goals Unmet:  Not Applicable  Comments: Pt able to follow exercise prescription today without complaint.  Will continue to monitor for progression.    Dr. Mark Miller is Medical Director for [facility] and [facility]. 

## 2016-02-08 ENCOUNTER — Encounter: Payer: Medicare Other | Admitting: *Deleted

## 2016-02-08 DIAGNOSIS — Z955 Presence of coronary angioplasty implant and graft: Secondary | ICD-10-CM | POA: Diagnosis not present

## 2016-02-08 NOTE — Progress Notes (Signed)
Daily Session Note  Patient Details  Name: Kenneth Hahn MRN: [id number] Date of Birth: 02/04/1951 Referring Provider:   Flowsheet Row Cardiac Rehab from 01/16/2016 in [facility] Cardiac and Pulmonary Rehab  Referring Provider  Serafina Royals MD      Encounter Date: 02/08/2016  Check In:     Session Check In - 02/08/16 1636      Check-In   Location [facility]-Cardiac & Pulmonary Rehab   Staff Present Gerlene Burdock, RN, Vickki Hearing, BA, ACSM CEP, Exercise Physiologist;Other  Raford Pitcher R.N.    Supervising physician immediately available to respond to emergencies See telemetry face sheet for immediately available ER MD   Medication changes reported     No   Fall or balance concerns reported    No   Warm-up and Cool-down Performed on first and last piece of equipment   Resistance Training Performed Yes   VAD Patient? No     Pain Assessment   Currently in Pain? No/denies         Goals Met:  Proper associated with RPD/PD & O2 Sat Exercise tolerated well  Goals Unmet:  Not Applicable  Comments:     Dr. Emily Filbert is Medical Director for [place] and [facility].

## 2016-02-09 DIAGNOSIS — Z955 Presence of coronary angioplasty implant and graft: Secondary | ICD-10-CM | POA: Diagnosis not present

## 2016-02-09 NOTE — Progress Notes (Signed)
Daily Session Note  Patient Details  Name: Kenneth Hahn MRN: [id number] Date of Birth: Jan 13, 1951 Referring Provider:   Flowsheet Row Cardiac Rehab from 01/16/2016 in [facility] Cardiac and Pulmonary Rehab  Referring Provider  Serafina Royals MD      Encounter Date: 02/09/2016  Check In:     Session Check In - 02/09/16 1717      Check-In   Location [facility]Card[facility] & Pulmonary Rehab   Staff Present Nyoka Cowden, RN, BSN, [place], BS, ACSM CEP, Exercise Physiologist;Lasundra Hascall Oletta Darter, [place], ACSM CEP, Exercise Physiologist;Other   Supervising physician immediately available to respond to emergencies See telemetry face sheet for immediately available ER MD   Medication changes reported     No   Fall or balance concerns reported    No   Warm-up and Cool-down Performed on first and last piece of equipment   Resistance Training Performed Yes   VAD Patient? No     Pain Assessment   Currently in Pain? No/denies   Multiple Pain Sites No           Exercise Prescription Changes - 02/09/16 1300      Exercise Review   Progression Yes     Response to Exercise   Blood Pressure (Admit) 128/78   Blood Pressure (Exercise) 118/64   Blood Pressure (Exit) 124/78   Heart Rate (Admit) 56 bpm   Heart Rate (Exercise) 140 bpm   Rating of Perceived Exertion (Exercise) 12   Symptoms none   Duration Progress to 45 minutes of aerobic exercise without signs/symptoms of physical distress   Intensity THRR unchanged     Progression   Progression Continue to progress workloads to maintain intensity without signs/symptoms of physical distress.     Resistance Training   Training Prescription Yes   Weight 3   Reps 10-12     Interval Training   Interval Training No     Treadmill   MPH 2   Grade 1   Minutes 15   METs 2.81     NuStep   Level 3   Minutes 15   METs 1.7      Goals Met:  Independence with exercise equipment Exercise tolerated well No report of cardiac concerns  or symptoms Strength training completed today  Goals Unmet:  Not Applicable  Comments: Pt able to follow exercise prescription today without complaint.  Will continue to monitor for progression.    Dr. Emily Filbert is Medical Director for [place] and [facility].

## 2016-02-13 ENCOUNTER — Encounter: Payer: Medicare Other | Admitting: *Deleted

## 2016-02-13 DIAGNOSIS — Z955 Presence of coronary angioplasty implant and graft: Secondary | ICD-10-CM

## 2016-02-13 NOTE — Progress Notes (Signed)
Daily Session Note  Patient Details  Name: Kenneth Hahn MRN: [id number] Date of Birth: Aug 01, 1950 Referring Provider:   Flowsheet Row Cardiac Rehab from 01/16/2016 in [facility] Cardiac and Pulmonary Rehab  Referring Provider  Serafina Royals MD      Encounter Date: 02/13/2016  Check In:     Session Check In - 02/13/16 1632      Check-In   Location [facility]-Cardiac & Pulmonary Rehab   Staff Present Heath Lark, RN, BSN, Laveda Norman, BS, ACSM CEP, Exercise Physiologist;Other   Supervising physician immediately available to respond to emergencies See telemetry face sheet for immediately available ER MD   Medication changes reported     No   Fall or balance concerns reported    No   Warm-up and Cool-down Performed on first and last piece of equipment   Resistance Training Performed Yes   VAD Patient? No     Pain Assessment   Currently in Pain? No/denies   Multiple Pain Sites No         Goals Met:  Independence with exercise equipment Exercise tolerated well No report of cardiac concerns or symptoms Strength training completed today  Goals Unmet:  Not Applicable  Comments: Pt able to follow exercise prescription today without complaint.  Will continue to monitor for progression.    Dr. Emily Filbert is Medical Director for [place] and [facility].

## 2016-02-15 ENCOUNTER — Encounter: Payer: Medicare Other | Admitting: *Deleted

## 2016-02-15 DIAGNOSIS — Z955 Presence of coronary angioplasty implant and graft: Secondary | ICD-10-CM | POA: Diagnosis not present

## 2016-02-15 NOTE — Progress Notes (Signed)
Daily Session Note  Patient Details  Name: Kenneth Hahn MRN: [id number] Date of Birth: 11-03-1950 Referring Provider:   Flowsheet Row Cardiac Rehab from 01/16/2016 in [facility] Cardiac and Pulmonary Rehab  Referring Provider  Serafina Royals MD      Encounter Date: 02/15/2016  Check In:     Session Check In - 02/15/16 1722      Check-In   Location [facility] Re[facility]   Staff Present Gerlene Burdock, RN, Vickki Hearing, BA, ACSM CEP, Exercise Physiologist  Jena Gauss, RN   Supervising physician immediately available to respond to emergencies See telemetry face sheet for immediately available ER MD   Medication changes reported     No   Fall or balance concerns reported    No   Warm-up and Cool-down Performed on first and last piece of equipment   Resistance Training Performed Yes   VAD Patient? No     Pain Assessment   Currently in Pain? No/denies         Goals Met:  Proper associated with RPD/PD & O2 Sat Exercise tolerated well  Goals Unmet:  Not Applicable  Comments:     Dr. Emily Filbert is Medical Director for [place] and [facility].

## 2016-02-20 ENCOUNTER — Encounter: Payer: Medicare Other | Admitting: *Deleted

## 2016-02-20 DIAGNOSIS — Z955 Presence of coronary angioplasty implant and graft: Secondary | ICD-10-CM

## 2016-02-20 NOTE — Progress Notes (Signed)
Daily Session Note  Patient Details  Name: Kenneth Hahn MRN: [id number] Date of Birth: 1950-06-11 Referring Provider:   Flowsheet Row Cardiac Rehab from 01/16/2016 in [facility] Cardiac and Pulmonary Rehab  Referring Provider  Serafina Royals MD      Encounter Date: 02/20/2016  Check In:     Session Check In - 02/20/16 1654      Check-In   Location [facility]-Card[facility] & Pul[facility] Rehab   Staff Present Heath Lark, RN, BSN, CCRP;Montey Ebel, RN, Moises Blood, BS, ACSM CEP, Exercise Physiologist   Supervising physician immediately available to respond to emergencies See telemetry face sheet for immediately available ER MD   Medication changes reported     No   Fall or balance concerns reported    No   Warm-up and Cool-down Performed on first and last piece of equipment   Resistance Training Performed Yes   VAD Patient? No     Pain Assessment   Currently in Pain? No/denies         Goals Met:  Proper associated with RPD/PD & O2 Sat Exercise tolerated well No report of cardiac concerns or symptoms  Goals Unmet:  Not Applicable  Comments:     Dr. Emily Filbert is Medical Director for [place] and [facility].

## 2016-02-22 ENCOUNTER — Encounter: Payer: Medicare Other | Admitting: *Deleted

## 2016-02-22 DIAGNOSIS — Z955 Presence of coronary angioplasty implant and graft: Secondary | ICD-10-CM

## 2016-02-22 NOTE — Progress Notes (Signed)
Daily Session Note  Patient Details  Name: Kenneth Hahn MRN: [id number] Date of Birth: January 12, 1951 Referring Provider:   Flowsheet Row Cardiac Rehab from 01/16/2016 in [facility] Cardiac and Pulmonary Rehab  Referring Provider  Serafina Royals MD      Encounter Date: 02/22/2016  Check In:     Session Check In - 02/22/16 1631      Check-In   Staff Present Heath Lark, RN, BSN, CCRP;Amanda Sommer, BA, ACSM CEP, Exercise Physiologist  Levell July RN BSN   Supervising physician immediately available to respond to emergencies See telemetry face sheet for immediately available ER MD   Medication changes reported     No   Fall or balance concerns reported    No   Warm-up and Cool-down Performed on first and last piece of equipment   Resistance Training Performed Yes   VAD Patient? No     Pain Assessment   Currently in Pain? No/denies         Goals Met:  Exercise tolerated well Personal goals reviewed No report of cardiac concerns or symptoms Strength training completed today  Goals Unmet:  Not Applicable  Comments: Doing well with exercise prescription progression.    Dr. Emily Filbert is Medical Director for [place] and [facility].

## 2016-02-23 DIAGNOSIS — Z955 Presence of coronary angioplasty implant and graft: Secondary | ICD-10-CM | POA: Diagnosis not present

## 2016-02-23 NOTE — Progress Notes (Signed)
Daily Session Note  Patient Details  Name: Kenneth Hahn MRN: [id number] Date of Birth: 12/29/1950 Referring Provider:   Flowsheet Row Cardiac Rehab from 01/16/2016 in [facility] Cardiac and Pulmonary Rehab  Referring Provider  Kowalski, Bruce MD      Encounter Date: 02/23/2016  Check In:     Session Check In - 02/23/16 1749      Check-In   Location [facility]-Card[facility] & Pulmonary Rehab   Staff Present Kelly Hayes, BS, ACSM CEP, Exercise Physiologist;Amanda Sommer, BA, ACSM CEP, Exercise Physiologist;Other   Supervising physician immediately available to respond to emergencies See telemetry face sheet for immediately available ER MD   Medication changes reported     No   Fall or balance concerns reported    No   Warm-up and Cool-down Performed on first and last piece of equipment   Resistance Training Performed Yes   VAD Patient? No     Pain Assessment   Currently in Pain? No/denies   Multiple Pain Sites No           Exercise Prescription Changes - 02/23/16 1100      Exercise Review   Progression Yes     Response to Exercise   Blood Pressure (Admit) 118/64   Blood Pressure (Exercise) 130/68   Blood Pressure (Exit) 122/64   Heart Rate (Admit) 123 bpm   Heart Rate (Exercise) 128 bpm   Heart Rate (Exit) 108 bpm   Rating of Perceived Exertion (Exercise) 13   Duration Progress to 45 minutes of aerobic exercise without signs/symptoms of physical distress   Intensity THRR unchanged     Progression   Progression Continue to progress workloads to maintain intensity without signs/symptoms of physical distress.   Average METs 2.85     Resistance Training   Training Prescription Yes   Weight 4   Reps 10-12     Treadmill   MPH 2   Grade 1   Minutes 15   METs 2.81     NuStep   Level 3   Minutes 15   METs 2.9      Goals Met:  Independence with exercise equipment Exercise tolerated well No report of cardiac concerns or symptoms Strength training completed  today  Goals Unmet:  Not Applicable  Comments: Pt able to follow exercise prescription today without complaint.  Will continue to monitor for progression.    Dr. Mark Miller is Medical Director for [facility] and [facility]. 

## 2016-02-27 ENCOUNTER — Encounter: Payer: Medicare Other | Attending: Cardiology

## 2016-02-27 DIAGNOSIS — Z955 Presence of coronary angioplasty implant and graft: Secondary | ICD-10-CM

## 2016-02-27 NOTE — Progress Notes (Signed)
Daily Session Note  Patient Details  Name: Kenneth Hahn MRN: [id number] Date of Birth: 12-05-1950 Referring Provider:   Flowsheet Row Cardiac Rehab from 01/16/2016 in [facility] Cardiac and Pulmonary Rehab  Referring Provider  Serafina Royals MD      Encounter Date: 02/27/2016  Check In:     Session Check In - 02/27/16 1754      Check-In   Location [facility]-Cardiac & Pulmonary Rehab   Staff Present Heath Lark, RN, BSN, CCRP;Prabhjot Maddux, DPT, Ronaldo Miyamoto, BS, ACSM CEP, Exercise Physiologist   Supervising physician immediately available to respond to emergencies See telemetry face sheet for immediately available ER MD   Medication changes reported     No   Fall or balance concerns reported    No   Warm-up and Cool-down Performed on first and last piece of equipment   Resistance Training Performed Yes   VAD Patient? No     Pain Assessment   Currently in Pain? No/denies   Multiple Pain Sites No         Goals Met:  Independence with exercise equipment  Goals Unmet:  Not Applicable  Comments: Patient completed exercise prescription and all exercise goals during rehab session. The exercise was tolerated well and the patient is progressing in the program.    Dr. Emily Filbert is Medical Director for [place] and [facility].

## 2016-02-29 ENCOUNTER — Emergency Department: Payer: Medicare Other

## 2016-02-29 ENCOUNTER — Inpatient Hospital Stay
Admission: EM | Admit: 2016-02-29 | Discharge: 2016-03-02 | DRG: 308 | Disposition: A | Discharge status: Home / Self Care | Payer: Medicare Other | Attending: Internal Medicine | Admitting: Internal Medicine

## 2016-02-29 ENCOUNTER — Encounter: Payer: Medicare Other | Admitting: *Deleted

## 2016-02-29 ENCOUNTER — Encounter: Payer: Self-pay | Admitting: *Deleted

## 2016-02-29 ENCOUNTER — Other Ambulatory Visit: Payer: Self-pay

## 2016-02-29 VITALS — BP 142/80 | HR 150

## 2016-02-29 DIAGNOSIS — R000 Tachycardia, unspecified: Secondary | ICD-10-CM | POA: Diagnosis present

## 2016-02-29 DIAGNOSIS — I482 Chronic atrial fibrillation: Principal | ICD-10-CM | POA: Diagnosis present

## 2016-02-29 DIAGNOSIS — Z87891 Personal history of nicotine dependence: Secondary | ICD-10-CM

## 2016-02-29 DIAGNOSIS — Z807 Family history of other malignant neoplasms of lymphoid, hematopoietic and related tissues: Secondary | ICD-10-CM

## 2016-02-29 DIAGNOSIS — E785 Hyperlipidemia, unspecified: Secondary | ICD-10-CM | POA: Diagnosis present

## 2016-02-29 DIAGNOSIS — Z7901 Long term (current) use of anticoagulants: Secondary | ICD-10-CM

## 2016-02-29 DIAGNOSIS — E119 Type 2 diabetes mellitus without complications: Secondary | ICD-10-CM | POA: Diagnosis present

## 2016-02-29 DIAGNOSIS — Z7984 Long term (current) use of oral hypoglycemic drugs: Secondary | ICD-10-CM

## 2016-02-29 DIAGNOSIS — I2510 Atherosclerotic heart disease of native coronary artery without angina pectoris: Secondary | ICD-10-CM | POA: Diagnosis present

## 2016-02-29 DIAGNOSIS — I4891 Unspecified atrial fibrillation: Secondary | ICD-10-CM | POA: Diagnosis not present

## 2016-02-29 DIAGNOSIS — R0902 Hypoxemia: Secondary | ICD-10-CM | POA: Diagnosis present

## 2016-02-29 DIAGNOSIS — Z9889 Other specified postprocedural states: Secondary | ICD-10-CM

## 2016-02-29 DIAGNOSIS — Z8249 Family history of ischemic heart disease and other diseases of the circulatory system: Secondary | ICD-10-CM

## 2016-02-29 DIAGNOSIS — Z833 Family history of diabetes mellitus: Secondary | ICD-10-CM

## 2016-02-29 DIAGNOSIS — Z955 Presence of coronary angioplasty implant and graft: Secondary | ICD-10-CM

## 2016-02-29 DIAGNOSIS — I5033 Acute on chronic diastolic (congestive) heart failure: Secondary | ICD-10-CM | POA: Diagnosis present

## 2016-02-29 DIAGNOSIS — N400 Enlarged prostate without lower urinary tract symptoms: Secondary | ICD-10-CM | POA: Diagnosis present

## 2016-02-29 DIAGNOSIS — Z79899 Other long term (current) drug therapy: Secondary | ICD-10-CM

## 2016-02-29 DIAGNOSIS — I110 Hypertensive heart disease with heart failure: Secondary | ICD-10-CM | POA: Diagnosis present

## 2016-02-29 DIAGNOSIS — Z86010 Personal history of colonic polyps: Secondary | ICD-10-CM

## 2016-02-29 DIAGNOSIS — I081 Rheumatic disorders of both mitral and tricuspid valves: Secondary | ICD-10-CM | POA: Diagnosis present

## 2016-02-29 DIAGNOSIS — Z801 Family history of malignant neoplasm of trachea, bronchus and lung: Secondary | ICD-10-CM

## 2016-02-29 DIAGNOSIS — Z818 Family history of other mental and behavioral disorders: Secondary | ICD-10-CM

## 2016-02-29 DIAGNOSIS — Z808 Family history of malignant neoplasm of other organs or systems: Secondary | ICD-10-CM

## 2016-02-29 LAB — CBC
HCT: 36.8 % — ABNORMAL LOW (ref 40.0–52.0)
HEMOGLOBIN: 12.9 g/dL — AB (ref 13.0–18.0)
MCH: 32.5 pg (ref 26.0–34.0)
MCHC: 35 g/dL (ref 32.0–36.0)
MCV: 92.8 fL (ref 80.0–100.0)
Platelets: 186 10*3/uL (ref 150–440)
RBC: 3.97 MIL/uL — ABNORMAL LOW (ref 4.40–5.90)
RDW: 14.9 % — AB (ref 11.5–14.5)
WBC: 8.3 10*3/uL (ref 3.8–10.6)

## 2016-02-29 LAB — BASIC METABOLIC PANEL
ANION GAP: 13 (ref 5–15)
BUN: 13 mg/dL (ref 6–20)
CALCIUM: 8.7 mg/dL — AB (ref 8.9–10.3)
CO2: 24 mmol/L (ref 22–32)
Chloride: 100 mmol/L — ABNORMAL LOW (ref 101–111)
Creatinine, Ser: 1.06 mg/dL (ref 0.61–1.24)
GFR calc Af Amer: >60 mL/min (ref >60)
GLUCOSE: 169 mg/dL — AB (ref 65–99)
Potassium: 3.5 mmol/L (ref 3.5–5.1)
Sodium: 137 mmol/L (ref 135–145)

## 2016-02-29 LAB — MAGNESIUM: MAGNESIUM: 1.5 mg/dL — AB (ref 1.7–2.4)

## 2016-02-29 LAB — GLUCOSE, CAPILLARY: GLUCOSE-CAPILLARY: 170 mg/dL — AB (ref 65–99)

## 2016-02-29 LAB — TROPONIN I

## 2016-02-29 LAB — PROTIME-INR
INR: 2.49
Prothrombin Time: 27.4 seconds — ABNORMAL HIGH (ref 11.4–15.2)

## 2016-02-29 MED ORDER — DILTIAZEM LOAD VIA INFUSION
10.0000 mg | Freq: Once | INTRAVENOUS | Status: DC
  Filled 2016-02-29: qty 10

## 2016-02-29 MED ORDER — DEXTROSE 5 % IV SOLN
5.0000 mg/h | INTRAVENOUS | Status: DC
  Administered 2016-02-29: 5 mg/h via INTRAVENOUS
  Administered 2016-03-01: 15 mg/h via INTRAVENOUS
  Administered 2016-03-01: 10 mg/h via INTRAVENOUS
  Administered 2016-03-01: 15 mg/h via INTRAVENOUS
  Filled 2016-02-29 (×4): qty 100

## 2016-02-29 MED ORDER — METOPROLOL TARTRATE 5 MG/5ML IV SOLN
5.0000 mg | Freq: Once | INTRAVENOUS | Status: AC
  Administered 2016-02-29: 5 mg via INTRAVENOUS
  Filled 2016-02-29: qty 5

## 2016-02-29 MED ORDER — DILTIAZEM HCL 25 MG/5ML IV SOLN
INTRAVENOUS | Status: AC
  Administered 2016-02-29: 10 mg
  Filled 2016-02-29: qty 5

## 2016-02-29 NOTE — ED Notes (Signed)
ED Provider at bedside. 

## 2016-02-29 NOTE — Progress Notes (Signed)
Daily Session Note  Patient Details  Name: Kenneth Hahn MRN: [id number] Date of Birth: 01/27/1951 Referring Provider:   Flowsheet Row Cardiac Rehab from 01/16/2016 in [facility] Cardiac and Pulmonary Rehab  Referring Provider  Kowalski, Bruce MD      Encounter Date: 02/29/2016  Check In:     Session Check In - 02/29/16 1730      Check-In   Location [facility]-Cardiac & Pul[facility] Rehab   Staff Present Carroll Enterkin, RN, BSN;Amanda Sommer, BA, ACSM CEP, Exercise Physiologist  Tricia Surles, RN   Supervising physician immediately available to respond to emergencies See telemetry face sheet for immediately available ER MD   Medication changes reported     No   Fall or balance concerns reported    No   Warm-up and Cool-down Not performed (comment)   Resistance Training Performed No   VAD Patient? No     Pain Assessment   Currently in Pain? No/denies         Goals Met:  Independence with exercise equipment  Goals Unmet:  HR  Comments: Heart rate 130-150 unable to get down so taken to [facility] via wheelchair.     Dr. Mark Miller is Medical Director for [facility] and [facility]. 

## 2016-02-29 NOTE — Progress Notes (Signed)
Cardiac Individual Treatment Plan  Patient Details  Name: Kenneth Hahn MRN: [id number] Date of Birth: 1950/09/20 Referring Provider:   Flowsheet Row Cardiac Rehab from 01/16/2016 in [facility] Cardiac and Pulmonary Rehab  Referring Provider  Serafina Royals MD      Initial Encounter Date:  Flowsheet Row Cardiac Rehab from 01/16/2016 in [facility] Cardiac and Pulmonary Rehab  Date  01/16/16  Referring Provider  Serafina Royals MD      Visit Diagnosis: Status post coronary artery stent placement  Patient's Home Medications on Admission:  Current Outpatient Prescriptions:  .  amLODipine-benazepril (LOTREL) 5-20 MG per capsule, Take 1 capsule by mouth daily., Disp: , Rfl:  .  atenolol (TENORMIN) 25 MG tablet, Take 25 mg by mouth 2 (two) times daily. Take one tablet morning and evening, Disp: , Rfl:  .  atorvastatin (LIPITOR) 80 MG tablet, Take 1 tablet (80 mg total) by mouth daily at 6 PM., Disp: 90 tablet, Rfl: 4 .  Cholecalciferol (VITAMIN D-3) 5000 UNITS TABS, Take 1 tablet by mouth daily., Disp: , Rfl:  .  clopidogrel (PLAVIX) 75 MG tablet, Take 1 tablet (75 mg total) by mouth daily with breakfast., Disp: 90 tablet, Rfl: 4 .  clopidogrel (PLAVIX) 75 MG tablet, Take 1 tablet (75 mg total) by mouth daily., Disp: 30 tablet, Rfl: 5 .  furosemide (LASIX) 20 MG tablet, Take 20 mg by mouth daily., Disp: , Rfl:  .  insulin glargine (LANTUS) 100 UNIT/ML injection, Inject 0.62 mLs (62 Units total) into the skin at bedtime., Disp: 10 mL, Rfl: 11 .  METFORMIN HCL PO, Take 500 mg by mouth daily. , Disp: , Rfl:  .  Multiple Vitamin (MULTIVITAMIN) tablet, Take 1 tablet by mouth daily., Disp: , Rfl:  .  ondansetron (ZOFRAN) 4 MG/2ML SOLN injection, Inject 2 mLs (4 mg total) into the vein every 6 (six) hours as needed for nausea. (Patient not taking: Reported on 01/16/2016), Disp: 2 mL, Rfl: 0 .  tamsulosin (FLOMAX) 0.4 MG CAPS capsule, Take 0.4 mg by mouth at bedtime., Disp: , Rfl:  .  warfarin  (COUMADIN) 3 MG tablet, Take 3 mg by mouth one time only at 6 PM., Disp: , Rfl:   Past Medical History: Past Medical History:  Diagnosis Date  . Atrial fibrillation ([place])   . Colon polyps   . Diabetes ([place])   . Hyperlipemia   . Hypertension     Tobacco Use: History  Smoking Status  . Former Smoker  . Quit date: 03/26/1973  Smokeless Tobacco  . Never Used    Labs: Recent Review Flowsheet Data    There is no flowsheet data to display.       Exercise Target Goals:    Exercise Program Goal: Individual exercise prescription set with THRR, safety & activity barriers. Participant demonstrates ability to understand and report RPE using BORG scale, to self-measure pulse accurately, and to acknowledge the importance of the exercise prescription.  Exercise Prescription Goal: Starting with aerobic activity 30 plus minutes a day, 3 days per week for initial exercise prescription. Provide home exercise prescription and guidelines that participant acknowledges understanding prior to discharge.  Activity Barriers & Risk Stratification:     Activity Barriers & Cardiac Risk Stratification - 01/16/16 1223      Activity Barriers & Cardiac Risk Stratification   Activity Barriers Arthritis;Joint Problems;Back Problems;Deconditioning;Muscular Weakness;Left Knee Replacement;Decreased Ventricular Function;Shortness of Breath   Cardiac Risk Stratification High      6 Minute Walk:  6 Minute Walk    Row Name 01/16/16 1405         6 Minute Walk   Phase Initial     Distance 1170 feet     Walk Time 6 minutes     # of Rest Breaks 0     MPH 2.22     METS 2.68     RPE 13     Perceived Dyspnea  4     VO2 Peak 7.82     Symptoms Yes (comment)     Comments SOB, chronic hip pain (2/10), chronic right ankle pain (2/10)     Resting HR 75 bpm     Resting BP 116/64     Max Ex. HR 109 bpm     Max Ex. BP 146/74     2 Minute Post BP 136/74        Initial Exercise Prescription:      Initial Exercise Prescription - 01/16/16 1400      Date of Initial Exercise RX and Referring Provider   Date 01/16/16   Referring Provider Serafina Royals MD     Treadmill   MPH 2   Grade 0   Minutes 15   METs 2.53     NuStep   Level 1   Minutes 15   METs 2     Biostep-RELP   Level 1   Minutes 15   METs 2     Prescription Details   Frequency (times per week) 3   Duration Progress to 45 minutes of aerobic exercise without signs/symptoms of physical distress     Intensity   THRR 40-80% of Max Heartrate 107-138   Ratings of Perceived Exertion 11-15   Perceived Dyspnea 0-4     Progression   Progression Continue to progress workloads to maintain intensity without signs/symptoms of physical distress.     Resistance Training   Training Prescription Yes   Weight 3 lbs   Reps 10-12      Perform Capillary Blood Glucose checks as needed.  Exercise Prescription Changes:     Exercise Prescription Changes    Row Name 01/16/16 1400 01/25/16 1300 02/09/16 1300 02/23/16 1100       Exercise Review   Progression -  Walk test results  - Yes Yes      Response to Exercise   Blood Pressure (Admit) 116/64 140/68 128/78 118/64    Blood Pressure (Exercise) 146/74 162/88 118/64 130/68    Blood Pressure (Exit) 136/74 132/60 124/78 122/64    Heart Rate (Admit) 75 bpm 88 bpm 56 bpm 123 bpm    Heart Rate (Exercise) 109 bpm 125 bpm 140 bpm 128 bpm    Heart Rate (Exit) 85 bpm 97 bpm  - 108 bpm    Oxygen Saturation (Admit) 97 %  -  -  -    Oxygen Saturation (Exercise) 99 %  -  -  -    Rating of Perceived Exertion (Exercise) _0 Perceived Dyspnea (Exercise) 4  -  -  -    Symptoms SOB, chronic pain in hips and right ankle none none  -    Duration  - Progress to 45 minutes of aerobic exercise without signs/symptoms of physical distress Progress to 45 minutes of aerobic exercise without signs/symptoms of physical distress Progress to 45 minutes of aerobic exercise without  signs/symptoms of physical distress    Intensity  - THRR unchanged THRR unchanged THRR unchanged  Progression   Progression  - Continue to progress workloads to maintain intensity without signs/symptoms of physical distress. Continue to progress workloads to maintain intensity without signs/symptoms of physical distress. Continue to progress workloads to maintain intensity without signs/symptoms of physical distress.    Average METs  -  -  - 2.85      Resistance Training   Training Prescription  - Yes Yes Yes    Weight  - _0 Reps  - 10-12 10-12 10-12      Interval Training   Interval Training  - No No  -      Treadmill   MPH  - _1 Grade  - 0 1 1    Minutes  - _2 METs  - 2.53 2.81 2.81      NuStep   Level  - _3 Minutes  - _4 METs  - 2.1 1.7 2.9       Exercise Comments:     Exercise Comments    Row Name 01/16/16 1409 01/25/16 1401 02/09/16 1332 02/23/16 1145     Exercise Comments Dinh wants to get into better overall shape and lose weight. Jesstin did very well in his first week of exercise. Joe has progressed well with exercise.  He has had some knee pain that limits his walking more than other equipment. Joe continues to progress well with exercise.       Discharge Exercise Prescription (Final Exercise Prescription Changes):     Exercise Prescription Changes - 02/23/16 1100      Exercise Review   Progression Yes     Response to Exercise   Blood Pressure (Admit) 118/64   Blood Pressure (Exercise) 130/68   Blood Pressure (Exit) 122/64   Heart Rate (Admit) 123 bpm   Heart Rate (Exercise) 128 bpm   Heart Rate (Exit) 108 bpm   Rating of Perceived Exertion (Exercise) 13   Duration Progress to 45 minutes of aerobic exercise without signs/symptoms of physical distress   Intensity THRR unchanged     Progression   Progression Continue to progress workloads to maintain intensity without signs/symptoms of physical distress.    Average METs 2.85     Resistance Training   Training Prescription Yes   Weight 4   Reps 10-12     Treadmill   MPH 2   Grade 1   Minutes 15   METs 2.81     NuStep   Level 3   Minutes 15   METs 2.9      Nutrition:  Target Goals: Understanding of nutrition guidelines, daily intake of sodium <1525m, cholesterol <20104m calories 30% from fat and 7% or less from saturated fats, daily to have 5 or more servings of fruits and vegetables.  Biometrics:     Pre Biometrics - 01/16/16 1412      Pre Biometrics   Height 5' 9.9" (1.775 m)   Weight 289 lb 3.2 oz (131.2 kg)   Waist Circumference 49 inches   Hip Circumference 48.75 inches   Waist to Hip Ratio 1.01 %   BMI (Calculated) 41.7   Single Leg Stand 1.17 seconds       Nutrition Therapy Plan and Nutrition Goals:     Nutrition Therapy & Goals - 01/20/16 1451      Nutrition Therapy   Diet 1700kcal diabetes diet with DASH diet guidelines  Drug/Food Interactions Statins/Certain Fruits;Coumadin/Vit K   Protein (specify units) 8oz   Fiber 30 grams   Whole Grain Foods 3 servings   Saturated Fats 14 max. grams   Fruits and Vegetables 5 servings/day   Sodium 1500 grams     Personal Nutrition Goals   Personal Goal #1 Work on portion control -- especially starches, meats -- eat generous portions of vegetables.   Personal Goal #2 Try eating slower by drinking water during meals, taking small bites, and/or chewing thoroughly.   Personal Goal #3 Continue with regular exercise     Intervention Plan   Intervention Prescribe, educate and counsel regarding individualized specific dietary modifications aiming towards targeted core components such as weight, hypertension, lipid management, diabetes, heart failure and other comorbidities.;Nutrition handout(s) given to patient.   Expected Outcomes Short Term Goal: Understand basic principles of dietary content, such as calories, fat, sodium, cholesterol and nutrients.;Short Term Goal:  A plan has been developed with personal nutrition goals set during dietitian appointment.;Long Term Goal: Adherence to prescribed nutrition plan.      Nutrition Discharge: Rate Your Plate Scores:     Nutrition Assessments - 01/16/16 1225      Rate Your Plate Scores   Pre Score 48   Pre Score % 53 %      Nutrition Goals Re-Evaluation:     Nutrition Goals Re-Evaluation    Row Name 02/22/16 1709             Personal Goal #1 Re-Evaluation   Personal Goal #1 Work on portion control -- especially starches, meats -- eat generous portions of vegetables.       Goal Progress Seen Yes       Comments Portions are smaller  Goal to continue to work on decreasing carbs and portions         Personal Goal #2 Re-Evaluation   Personal Goal #2 Try eating slower by drinking water during meals, taking small bites, and/or chewing thoroughly.       Goal Progress Seen No       Comments States is trying to work on this goal.  Drinking alot of diet soda and water in between.   Goal to continue to work on adding more water to daily meals         Personal Goal #3 Re-Evaluation   Personal Goal #3 Continue with regular exercise       Goal Progress Seen Yes       Comments Exercise  three days with program. Goal continue the exercise program          Psychosocial: Target Goals: Acknowledge presence or absence of depression, maximize coping skills, provide positive support system. Participant is able to verbalize types and ability to use techniques and skills needed for reducing stress and depression.  Initial Review & Psychosocial Screening:     Initial Psych Review & Screening - 01/16/16 1226      Family Dynamics   Good Support System? Yes     Screening Interventions   Interventions Encouraged to exercise      Quality of Life Scores:     Quality of Life - 01/16/16 1228      Quality of Life Scores   Health/Function Pre 21.93 %   Socioeconomic Pre 24.25 %   Psych/Spiritual Pre 19.86 %    Family Pre 24 %   GLOBAL Pre 22.23 %      PHQ-9: Recent Review Flowsheet Data    Depression screen [facility] 2/9  01/16/2016   Decreased Interest 0   Down, Depressed, Hopeless 0   PHQ - 2 Score 0   Altered sleeping 0   Tired, decreased energy 2   Change in appetite 2   Feeling bad or failure about yourself  0   Trouble concentrating 0   Moving slowly or fidgety/restless 1   Suicidal thoughts 0   PHQ-9 Score 5   Difficult doing work/chores Not difficult at all      Psychosocial Evaluation and Intervention:     Psychosocial Evaluation - 01/23/16 1710      Psychosocial Evaluation & Interventions   Interventions Encouraged to exercise with the program and follow exercise prescription   Comments Counselor met with Mr. Chauncey Cruel today for initial psychosocial evaluation.  He will be 65 years old next week and had a stent inserted several weeks ago.  He is married and his wife's adult children live close by.  Mr. Chauncey Cruel has adult children in the [place] area.  His first wife passed in 2008.  Mr. Chauncey Cruel had a knee replacement in 2016 and he currently has diabetes as well as heart problems.  He reports sleeping "too much," although he is up and down urinating frequently in the night - he is in the bed for 12-14 hours typically.  Mr. Chauncey Cruel states he is going to check with the Dr. about this being a possible medication reaction to one of his heart meds.  His appetite has decreased over the past several years; although he continues to contend with being overweight.  He denies a history or current symptoms of depression or anxiety.  He states his mood is typically positive and is even more positive when he isn't feeling so tired.  Mr. Chauncey Cruel reports he has minimal stress other than his health currently.  He has goals to increase his stamina and strength and have more energy while in this class.  Counselor encouraged him to check with his Dr. soon about the medications to see if this will improve his energy levels and decrease the  amount of time he is in the bed each day.  Staff will continue to follow with Mr. Chauncey Cruel throughout the course of this program.          Psychosocial Re-Evaluation:     Psychosocial Re-Evaluation    [place] Name 02/15/16 1650             Psychosocial Re-Evaluation   Comments Counselor Follow up with Mr. Chauncey Cruel today to see if he was sleeping any better since beginning this class and he stated the "quality" of his sleep has improved.  He also reports he does not like to come to exercise but he notices how much better he feels immediately after the class and that inspires him to keep coming.  He is still wondering if one of his medications is causing tiredness, but he also thinks it can just be a combination of his heart and his diabetes.  He is working on portion control to help with weight loss and has met with the dietician.  Counselor commended him on his commitment to exercise and improved health.           Vocational Rehabilitation: Provide vocational rehab assistance to qualifying candidates.   Vocational Rehab Evaluation & Intervention:     Vocational Rehab - 01/16/16 1224      Initial Vocational Rehab Evaluation & Intervention   Assessment shows need for Vocational Rehabilitation No      Education: Education  Goals: Education classes will be provided on a weekly basis, covering required topics. Participant will state understanding/return demonstration of topics presented.  Learning Barriers/Preferences:     Learning Barriers/Preferences - 01/16/16 1223      Learning Barriers/Preferences   Learning Barriers None   Learning Preferences Computer/Internet;Pictoral;Video      Education Topics: General Nutrition Guidelines/Fats and Fiber: -Group instruction provided by verbal, written material, models and posters to present the general guidelines for heart healthy nutrition. Gives an explanation and review of dietary fats and fiber. Flowsheet Row Cardiac Rehab from 02/27/2016 in  [facility] Cardiac and Pulmonary Rehab  Date  02/06/16  Educator  SB  Instruction Review Code  2- meets goals/outcomes      Controlling Sodium/Reading Food Labels: -Group verbal and written material supporting the discussion of sodium use in heart healthy nutrition. Review and explanation with models, verbal and written materials for utilization of the food label. Flowsheet Row Cardiac Rehab from 02/27/2016 in [facility] Cardiac and Pulmonary Rehab  Date  02/13/16  Educator  PI  Instruction Review Code  2- meets goals/outcomes      Exercise Physiology & Risk Factors: - Group verbal and written instruction with models to review the exercise physiology of the cardiovascular system and associated critical values. Details cardiovascular disease risk factors and the goals associated with each risk factor. Flowsheet Row Cardiac Rehab from 02/27/2016 in [facility] Cardiac and Pulmonary Rehab  Date  02/01/16  Educator  AS  Instruction Review Code  2- meets goals/outcomes      Aerobic Exercise & Resistance Training: - Gives group verbal and written discussion on the health impact of inactivity. On the components of aerobic and resistive training programs and the benefits of this training and how to safely progress through these programs. Flowsheet Row Cardiac Rehab from 02/27/2016 in [facility] Cardiac and Pulmonary Rehab  Date  02/20/16  Educator  K. Amedeo Plenty  Instruction Review Code  2- meets goals/outcomes      Flexibility, Balance, General Exercise Guidelines: - Provides group verbal and written instruction on the benefits of flexibility and balance training programs. Provides general exercise guidelines with specific guidelines to those with heart or lung disease. Demonstration and skill practice provided. Flowsheet Row Cardiac Rehab from 02/27/2016 in [facility] Cardiac and Pulmonary Rehab  Date  02/22/16  Educator  AS  Instruction Review Code  2- meets goals/outcomes      Stress Management: - Provides group  verbal and written instruction about the health risks of elevated stress, cause of high stress, and healthy ways to reduce stress.   Depression: - Provides group verbal and written instruction on the correlation between heart/lung disease and depressed mood, treatment options, and the stigmas associated with seeking treatment. Flowsheet Row Cardiac Rehab from 02/27/2016 in [facility] Cardiac and Pulmonary Rehab  Date  02/08/16  Educator  Berle Mull, MSW2  Instruction Review Code  2- meets goals/outcomes      Anatomy & Physiology of the Heart: - Group verbal and written instruction and models provide basic cardiac anatomy and physiology, with the coronary electrical and arterial systems. Review of: AMI, Angina, Valve disease, Heart Failure, Cardiac Arrhythmia, Pacemakers, and the ICD. Flowsheet Row Cardiac Rehab from 02/27/2016 in [facility] Cardiac and Pulmonary Rehab  Date  02/27/16  Educator  SB  Instruction Review Code  2- meets goals/outcomes      Cardiac Procedures: - Group verbal and written instruction and models to describe the testing methods done to diagnose heart disease. Reviews the outcomes of  the test results. Describes the treatment choices: Medical Management, Angioplasty, or Coronary Bypass Surgery.   Cardiac Medications: - Group verbal and written instruction to review commonly prescribed medications for heart disease. Reviews the medication, class of the drug, and side effects. Includes the steps to properly store meds and maintain the prescription regimen. Flowsheet Row Cardiac Rehab from 02/27/2016 in [facility] Cardiac and Pulmonary Rehab  Date  01/23/16 [part1]  Educator  SB  Instruction Review Code  2- meets goals/outcomes      Go Sex-Intimacy & Heart Disease, Get SMART - Goal Setting: - Group verbal and written instruction through game format to discuss heart disease and the return to sexual intimacy. Provides group verbal and written material to discuss and apply goal  setting through the application of the S.M.A.R.T. Method.   Other Matters of the Heart: - Provides group verbal, written materials and models to describe Heart Failure, Angina, Valve Disease, and Diabetes in the realm of heart disease. Includes description of the disease process and treatment options available to the cardiac patient. Flowsheet Row Cardiac Rehab from 02/27/2016 in [facility] Cardiac and Pulmonary Rehab  Date  02/27/16  Educator  SB  Instruction Review Code  2- meets goals/outcomes      Exercise & Equipment Safety: - Individual verbal instruction and demonstration of equipment use and safety with use of the equipment. Flowsheet Row Cardiac Rehab from 02/27/2016 in [facility] Cardiac and Pulmonary Rehab  Date  01/16/16  Educator  C. EnterkinRN  Instruction Review Code  2- meets goals/outcomes      Infection Prevention: - Provides verbal and written material to individual with discussion of infection control including proper hand washing and proper equipment cleaning during exercise session. Flowsheet Row Cardiac Rehab from 02/27/2016 in [facility] Cardiac and Pulmonary Rehab  Date  01/16/16  Educator  C. EnterkinRN  Instruction Review Code  2- meets goals/outcomes      Falls Prevention: - Provides verbal and written material to individual with discussion of falls prevention and safety. Flowsheet Row Cardiac Rehab from 02/27/2016 in [facility] Cardiac and Pulmonary Rehab  Date  01/16/16  Educator  C. [place]  Instruction Review Code  2- meets goals/outcomes      Diabetes: - Individual verbal and written instruction to review signs/symptoms of diabetes, desired ranges of glucose level fasting, after meals and with exercise. Advice that pre and post exercise glucose checks will be done for 3 sessions at entry of program. Flowsheet Row Cardiac Rehab from 02/27/2016 in [facility] Cardiac and Pulmonary Rehab  Date  01/16/16  Educator  C. [place]  Instruction Review Code  2- meets  goals/outcomes       Knowledge Questionnaire Score:     Knowledge Questionnaire Score - 01/16/16 1223      Knowledge Questionnaire Score   Pre Score 23/28      Core Components/Risk Factors/Patient Goals at Admission:     Personal Goals and Risk Factors at Admission - 01/16/16 1225      Core Components/Risk Factors/Patient Goals on Admission    Weight Management Yes;Obesity;Weight Loss   Intervention Weight Management: Develop a combined nutrition and exercise program designed to reach desired caloric intake, while maintaining appropriate intake of nutrient and fiber, sodium and fats, and appropriate energy expenditure required for the weight goal.;Weight Management: Provide education and appropriate resources to help participant work on and attain dietary goals.;Weight Management/Obesity: Establish reasonable short term and long term weight goals.;Obesity: Provide education and appropriate resources to help participant work on and attain  dietary goals.   Admit Weight 289 lb 3.2 oz (131.2 kg)   Goal Weight: Short Term 285 lb (129.3 kg)   Goal Weight: Long Term 220 lb (99.8 kg)   Expected Outcomes Short Term: Continue to assess and modify interventions until short term weight is achieved;Long Term: Adherence to nutrition and physical activity/exercise program aimed toward attainment of established weight goal;Weight Loss: Understanding of general recommendations for a balanced deficit meal plan, which promotes 1-2 lb weight loss per week and includes a negative energy balance of [phone] kcal/d;Understanding recommendations for meals to include 15-35% energy as protein, 25-35% energy from fat, 35-60% energy from carbohydrates, less than 267m of dietary cholesterol, 20-35 gm of total fiber daily;Understanding of distribution of calorie intake throughout the day with the consumption of 4-5 meals/snacks   Sedentary Yes   Intervention Provide advice, education, support and counseling about  physical activity/exercise needs.;Develop an individualized exercise prescription for aerobic and resistive training based on initial evaluation findings, risk stratification, comorbidities and participant's personal goals.   Expected Outcomes Achievement of increased cardiorespiratory fitness and enhanced flexibility, muscular endurance and strength shown through measurements of functional capacity and personal statement of participant.   Increase Strength and Stamina Yes   Intervention Provide advice, education, support and counseling about physical activity/exercise needs.;Develop an individualized exercise prescription for aerobic and resistive training based on initial evaluation findings, risk stratification, comorbidities and participant's personal goals.   Expected Outcomes Achievement of increased cardiorespiratory fitness and enhanced flexibility, muscular endurance and strength shown through measurements of functional capacity and personal statement of participant.   Diabetes Yes   Intervention Provide education about signs/symptoms and action to take for hypo/hyperglycemia.;Provide education about proper nutrition, including hydration, and aerobic/resistive exercise prescription along with prescribed medications to achieve blood glucose in normal ranges: Fasting glucose 65-99 mg/dL   Expected Outcomes Short Term: Participant verbalizes understanding of the signs/symptoms and immediate care of hyper/hypoglycemia, proper foot care and importance of medication, aerobic/resistive exercise and nutrition plan for blood glucose control.;Long Term: Attainment of HbA1C < 7%.   Heart Failure Yes   Intervention Provide a combined exercise and nutrition program that is supplemented with education, support and counseling about heart failure. Directed toward relieving symptoms such as shortness of breath, decreased exercise tolerance, and extremity edema.   Expected Outcomes Improve functional capacity of  life;Short term: Attendance in program 2-3 days a week with increased exercise capacity. Reported lower sodium intake. Reported increased fruit and vegetable intake. Reports medication compliance.;Short term: Daily weights obtained and reported for increase. Utilizing diuretic protocols set by physician.;Long term: Adoption of self-care skills and reduction of barriers for early signs and symptoms recognition and intervention leading to self-care maintenance.   Hypertension Yes   Intervention Provide education on lifestyle modifcations including regular physical activity/exercise, weight management, moderate sodium restriction and increased consumption of fresh fruit, vegetables, and low fat dairy, alcohol moderation, and smoking cessation.;Monitor prescription use compliance.   Expected Outcomes Short Term: Continued assessment and intervention until BP is < 140/932mHG in hypertensive participants. < 130/805mG in hypertensive participants with diabetes, heart failure or chronic kidney disease.;Long Term: Maintenance of blood pressure at goal levels.   Lipids Yes   Intervention Provide education and support for participant on nutrition & aerobic/resistive exercise along with prescribed medications to achieve LDL <82m25mDL >40mg61mExpected Outcomes Short Term: Participant states understanding of desired cholesterol values and is compliant with medications prescribed. Participant is following exercise prescription and nutrition guidelines.;Long Term: Cholesterol  controlled with medications as prescribed, with individualized exercise RX and with personalized nutrition plan. Value goals: LDL < 77m, HDL > 40 mg.      Core Components/Risk Factors/Patient Goals Review:      Goals and Risk Factor Review    Row Name 01/27/16 1416 02/22/16 1715 02/22/16 1719         Core Components/Risk Factors/Patient Goals Review   Personal Goals Review Weight Management/Obesity;Increase Strength and  Stamina;Lipids;Hypertension;Diabetes Weight Management/Obesity;Hypertension;Lipids;Heart Failure;Diabetes  -     Review Joes weight is currently 286.  he has met with the dietician and confirmed his knowledge of ways to manage his DM.  He is also watching his food intake.    He is exercising 3 days per week and reports feeling better after exercise.  HTN and cholesterol are the same and he is taking meds as directed.   -Durenda Hurtstates his weight is running up and down, though hehas maintained 3 pounds down. He is working on his nutrition goals and is attending class for the exercise.  He stated that his energy levels do flucuate, and he thinks he has more stamina than he did when he started,. He feels the exercise has been helful. He continues to maintain BP, chol diabetes in good range with the medications, nutrition and exercise.     Expected Outcomes Joe will continue to praactice healthier habits with diet and exercise and see an overall improvement in his health.   - Joe will continue to practice healthier habits with diet and exercise and see an overall improvement in his health.         Core Components/Risk Factors/Patient Goals at Discharge (Final Review):      Goals and Risk Factor Review - 02/22/16 1719      Core Components/Risk Factors/Patient Goals Review   Review JDurenda Hurtstates his weight is running up and down, though hehas maintained 3 pounds down. He is working on his nutrition goals and is attending class for the exercise.  He stated that his energy levels do flucuate, and he thinks he has more stamina than he did when he started,. He feels the exercise has been helful. He continues to maintain BP, chol diabetes in good range with the medications, nutrition and exercise.   Expected Outcomes Joe will continue to practice healthier habits with diet and exercise and see an overall improvement in his health.       ITP Comments:     ITP Comments    Row Name 01/16/16 1224 01/16/16 1258  02/01/16 0646 02/29/16 0640     ITP Comments JTrayshawnsaid he knows what to eat but he loves carbs like bread. He doesn't really like vegetables. ITP created during Cardiac Medical Review appt after the Cardiac Rehab informed consent was signed.  30 day review completed for Medical Director physician review and signature. Continue ITP unless changes made by physician. 30 day review completed for review by Dr MEmily Filbert  Continue with ITP unless changes noted by Dr MSabra Heck       Comments:

## 2016-02-29 NOTE — ED Provider Notes (Signed)
Time Seen: Approximately 1721  I have reviewed the triage notes  Chief Complaint: Tachycardia   History of Present Illness: Kenneth Hahn is a 65 y.o. male who presents from the cardiac rehabilitation area for a fast irregular heartbeat evaluation. Patient has had a history of atrial fibrillation intermittently. He states he is currently on Coumadin along with Plavix and has a stent. He is asymptomatic and states that he is usually not aware of when his heart goes in and out of atrial fibrillation. Pain, shortness of breath, near syncope, or any other new concerns.   Past Medical History:  Diagnosis Date  . Atrial fibrillation (HCC)   . Colon polyps   . Diabetes (HCC)   . Hyperlipemia   . Hypertension     Patient Active Problem List   Diagnosis Date Noted  . CAD (coronary artery disease) 01/04/2016  . Unstable angina (HCC) 12/27/2015  . Atrial fibrillation (HCC) 06/02/2012  . HTN (hypertension) 06/02/2012  . DM (diabetes mellitus) (HCC) 06/02/2012  . Hx of colonic polyps 06/02/2012  . Chronic anticoagulation 06/02/2012    Past Surgical History:  Procedure Laterality Date  . CARDIAC CATHETERIZATION Left 01/04/2016   Procedure: Left Heart Cath and Coronary Angiography;  Surgeon: Lamar BlinksBruce J Kowalski, MD;  Location: [facility];  Service: Cardiovascular;  Laterality: Left;  . CARDIAC CATHETERIZATION N/A 01/04/2016   Procedure: Coronary Stent Intervention;  Surgeon: Marcina MillardAlexander Paraschos, MD;  Location: [facility];  Service: Cardiovascular;  Laterality: N/A;  . CARPAL TUNNEL RELEASE     right hand  . ROTATOR CUFF REPAIR     right shoulder  . UMBILICAL HERNIA REPAIR      Past Surgical History:  Procedure Laterality Date  . CARDIAC CATHETERIZATION Left 01/04/2016   Procedure: Left Heart Cath and Coronary Angiography;  Surgeon: Lamar BlinksBruce J Kowalski, MD;  Location: [facility];  Service: Cardiovascular;  Laterality: Left;  . CARDIAC CATHETERIZATION  N/A 01/04/2016   Procedure: Coronary Stent Intervention;  Surgeon: Marcina MillardAlexander Paraschos, MD;  Location: [facility];  Service: Cardiovascular;  Laterality: N/A;  . CARPAL TUNNEL RELEASE     right hand  . ROTATOR CUFF REPAIR     right shoulder  . UMBILICAL HERNIA REPAIR      Current Outpatient Rx  . Order #: [id number] Class: Historical Med  . Order #: [id number] Class: Historical Med  . Order #: [id number] Class: Normal  . Order #: [id number] Class: Historical Med  . Order #: [id number] Class: Normal  . Order #: [id number] Class: Print  . Order #: [id number] Class: Historical Med  . Order #: [id number] Class: Normal  . Order #: [id number] Class: Historical Med  . Order #: [id number] Class: Historical Med  . Order #: [id number] Class: Normal  . Order #: [id number] Class: Historical Med  . Order #: [id number] Class: Historical Med    Allergies:  Patient has no known allergies.  Family History: Family History  Problem Relation Age of Onset  . Adopted: Yes  . Diabetes Father   . Heart disease Father   . Squamous cell carcinoma Sister   . Lymphoma Mother     Social History: Social History  Substance Use Topics  . Smoking status: Former Smoker    Quit date: 03/26/1973  . Smokeless tobacco: Never Used  . Alcohol use Yes     Comment: 2 drinks a day     Review of Systems:   10 point review of systems was performed and was otherwise negative:  Constitutional: No fever Eyes: No visual disturbances  ENT: No sore throat, ear pain Cardiac: No chest pain Respiratory: No shortness of breath, wheezing, or stridor Abdomen: No abdominal pain, no vomiting, No diarrhea Endocrine: No weight loss, No night sweats Extremities: No peripheral edema, cyanosis Skin: No rashes, easy bruising Neurologic: No focal weakness, trouble with speech or swollowing Urologic: No dysuria, Hematuria, or urinary frequency   Physical Exam:  ED Triage Vitals [02/29/16 1717]  Enc Vitals Group     BP (!) 167/104      Pulse Rate (!) 126     Resp 20     Temp 98.4 F (36.9 C)     Temp Source Oral     SpO2 99 %     Weight 287 lb (130.2 kg)     Height 5\' 9"  (1.753 m)     Head Circumference      Peak Flow      Pain Score 0     Pain Loc      Pain Edu?      Excl. in GC?     General: Awake , Alert , and Oriented times 3; GCS 15 Head: Normal cephalic , atraumatic Eyes: Pupils equal , round, reactive to light Nose/Throat: No nasal drainage, patent upper airway without erythema or exudate.  Neck: Supple, Full range of motion, No anterior adenopathy or palpable thyroid masses Lungs: Clear to ascultation without wheezes , rhonchi, or rales Heart: Irregular rate, irregular rhythm without murmurs gallops or rubs Abdomen: Soft, non tender without rebound, guarding , or rigidity; bowel sounds positive and symmetric in all 4 quadrants. No organomegaly .        Extremities: 2 plus symmetric pulses. No edema, clubbing or cyanosis Neurologic: normal ambulation, Motor symmetric without deficits, sensory intact Skin: warm, dry, no rashes   Labs:   All laboratory work was reviewed including any pertinent negatives or positives listed below:  Labs Reviewed  BASIC METABOLIC PANEL - Abnormal; Notable for the following:       Result Value   Chloride 100 (*)    Glucose, Bld 169 (*)    Calcium 8.7 (*)    All other components within normal limits  CBC - Abnormal; Notable for the following:    RBC 3.97 (*)    Hemoglobin 12.9 (*)    HCT 36.8 (*)    RDW 14.9 (*)    All other components within normal limits  GLUCOSE, CAPILLARY - Abnormal; Notable for the following:    Glucose-Capillary 170 (*)    All other components within normal limits  TROPONIN I  PROTIME-INR    EKG:  ED ECG REPORT I, Jennye MoccasinBrian S Garlen Reinig, the attending physician, personally viewed and interpreted this ECG.  Date: 02/29/2016 EKG Time: 1715 Rate: *132Rhythm: Atrial fibrillation with a rapid ventricular rate QRS Axis: normal Intervals:  normal ST/T Wave abnormalities: normal Conduction Disturbances: none Narrative Interpretation: unremarkable No acute ischemic changes are noted   Radiology: "Dg Chest 2 View  Result Date: 02/29/2016 CLINICAL DATA:  Chest pain.  Irregular heart rate. EXAM: CHEST  2 VIEW COMPARISON:  None. FINDINGS: The cardiomediastinal contours are normal. The lungs are clear. Pulmonary vasculature is normal. No consolidation, pleural effusion, or pneumothorax. No acute osseous abnormalities are seen. Degenerative change in the thoracic spine. IMPRESSION: No acute pulmonary process. Electronically Signed   By: Rubye OaksMelanie  Ehinger M.D.   On: 02/29/2016 19:27  "  I personally reviewed the radiologic studies   Critical Care: * CRITICAL CARE Performed by: Jennye MoccasinBrian S Baley Shands  Total critical care time: 33 minutes  Critical care time was exclusive of separately billable procedures and treating other patients.  Critical care was necessary to treat or prevent imminent or life-threatening deterioration.  Critical care was time spent personally by me on the following activities: development of treatment plan with patient and/or surrogate as well as nursing, discussions with consultants, evaluation of patient's response to treatment, examination of patient, obtaining history from patient or surrogate, ordering and performing treatments and interventions, ordering and review of laboratory studies, ordering and review of radiographic studies, pulse oximetry and re-evaluation of patient's condition.  Initiation of antiarrhythmic IV medication.   ED Course:  Patient's atrial fibrillation has slowed been started on a Cardizem bolus along with a Cardizem drip and also had Lopressor 5 mg IV added. Rates decreased her current level of 112 but still remains in atrial fibrillation. His blood pressure has improved. Patient's case was reviewed with the hospitalist and likely will cardiovert in the short-term on the Cardizem  drip. He also is a candidate for electrical cardioversion if deemed necessary. Clinical Course      Assessment:  Atrial fibrillation with a rapid ventricular rate History of coronary artery disease      Plan:  Inpatient            Jennye Moccasin, MD 02/29/16 Barry Brunner

## 2016-02-29 NOTE — ED Triage Notes (Signed)
Pt reports to ED w/ irregular HR.  Pt at cardiac rehab facility when staff noticed HR was SVT.  Pt denies CP, SOB, weakness, n/v/d.  HR 133

## 2016-03-01 DIAGNOSIS — Z79899 Other long term (current) drug therapy: Secondary | ICD-10-CM | POA: Diagnosis not present

## 2016-03-01 DIAGNOSIS — Z801 Family history of malignant neoplasm of trachea, bronchus and lung: Secondary | ICD-10-CM | POA: Diagnosis not present

## 2016-03-01 DIAGNOSIS — I081 Rheumatic disorders of both mitral and tricuspid valves: Secondary | ICD-10-CM | POA: Diagnosis present

## 2016-03-01 DIAGNOSIS — Z833 Family history of diabetes mellitus: Secondary | ICD-10-CM | POA: Diagnosis not present

## 2016-03-01 DIAGNOSIS — E119 Type 2 diabetes mellitus without complications: Secondary | ICD-10-CM | POA: Diagnosis present

## 2016-03-01 DIAGNOSIS — Z7984 Long term (current) use of oral hypoglycemic drugs: Secondary | ICD-10-CM | POA: Diagnosis not present

## 2016-03-01 DIAGNOSIS — I4891 Unspecified atrial fibrillation: Secondary | ICD-10-CM | POA: Diagnosis present

## 2016-03-01 DIAGNOSIS — I110 Hypertensive heart disease with heart failure: Secondary | ICD-10-CM | POA: Diagnosis present

## 2016-03-01 DIAGNOSIS — Z955 Presence of coronary angioplasty implant and graft: Secondary | ICD-10-CM | POA: Diagnosis not present

## 2016-03-01 DIAGNOSIS — I482 Chronic atrial fibrillation: Secondary | ICD-10-CM | POA: Diagnosis present

## 2016-03-01 DIAGNOSIS — Z808 Family history of malignant neoplasm of other organs or systems: Secondary | ICD-10-CM | POA: Diagnosis not present

## 2016-03-01 DIAGNOSIS — Z8249 Family history of ischemic heart disease and other diseases of the circulatory system: Secondary | ICD-10-CM | POA: Diagnosis not present

## 2016-03-01 DIAGNOSIS — Z818 Family history of other mental and behavioral disorders: Secondary | ICD-10-CM | POA: Diagnosis not present

## 2016-03-01 DIAGNOSIS — Z87891 Personal history of nicotine dependence: Secondary | ICD-10-CM | POA: Diagnosis not present

## 2016-03-01 DIAGNOSIS — R0902 Hypoxemia: Secondary | ICD-10-CM | POA: Diagnosis present

## 2016-03-01 DIAGNOSIS — I5033 Acute on chronic diastolic (congestive) heart failure: Secondary | ICD-10-CM | POA: Diagnosis present

## 2016-03-01 DIAGNOSIS — Z7901 Long term (current) use of anticoagulants: Secondary | ICD-10-CM | POA: Diagnosis not present

## 2016-03-01 DIAGNOSIS — Z86010 Personal history of colonic polyps: Secondary | ICD-10-CM | POA: Diagnosis not present

## 2016-03-01 DIAGNOSIS — Z9889 Other specified postprocedural states: Secondary | ICD-10-CM | POA: Diagnosis not present

## 2016-03-01 DIAGNOSIS — Z807 Family history of other malignant neoplasms of lymphoid, hematopoietic and related tissues: Secondary | ICD-10-CM | POA: Diagnosis not present

## 2016-03-01 DIAGNOSIS — R000 Tachycardia, unspecified: Secondary | ICD-10-CM | POA: Diagnosis present

## 2016-03-01 DIAGNOSIS — N400 Enlarged prostate without lower urinary tract symptoms: Secondary | ICD-10-CM | POA: Diagnosis present

## 2016-03-01 DIAGNOSIS — E785 Hyperlipidemia, unspecified: Secondary | ICD-10-CM | POA: Diagnosis present

## 2016-03-01 DIAGNOSIS — I2510 Atherosclerotic heart disease of native coronary artery without angina pectoris: Secondary | ICD-10-CM | POA: Diagnosis present

## 2016-03-01 LAB — BASIC METABOLIC PANEL
Anion gap: 8 (ref 5–15)
BUN: 11 mg/dL (ref 6–20)
CALCIUM: 8.3 mg/dL — AB (ref 8.9–10.3)
CO2: 27 mmol/L (ref 22–32)
CREATININE: 1.08 mg/dL (ref 0.61–1.24)
Chloride: 105 mmol/L (ref 101–111)
GFR calc non Af Amer: >60 mL/min (ref >60)
Glucose, Bld: 184 mg/dL — ABNORMAL HIGH (ref 65–99)
Potassium: 3.6 mmol/L (ref 3.5–5.1)
SODIUM: 140 mmol/L (ref 135–145)

## 2016-03-01 LAB — CBC
HCT: 32.8 % — ABNORMAL LOW (ref 40.0–52.0)
Hemoglobin: 11.4 g/dL — ABNORMAL LOW (ref 13.0–18.0)
MCH: 32.4 pg (ref 26.0–34.0)
MCHC: 34.7 g/dL (ref 32.0–36.0)
MCV: 93.2 fL (ref 80.0–100.0)
PLATELETS: 169 10*3/uL (ref 150–440)
RBC: 3.52 MIL/uL — AB (ref 4.40–5.90)
RDW: 14.6 % — AB (ref 11.5–14.5)
WBC: 7.2 10*3/uL (ref 3.8–10.6)

## 2016-03-01 LAB — GLUCOSE, CAPILLARY
GLUCOSE-CAPILLARY: 169 mg/dL — AB (ref 65–99)
GLUCOSE-CAPILLARY: 242 mg/dL — AB (ref 65–99)
GLUCOSE-CAPILLARY: 231 mg/dL — AB (ref 65–99)
Glucose-Capillary: 204 mg/dL — ABNORMAL HIGH (ref 65–99)
Glucose-Capillary: 241 mg/dL — ABNORMAL HIGH (ref 65–99)

## 2016-03-01 LAB — TROPONIN I
Troponin I: <0.03 ng/mL (ref <0.03)
Troponin I: <0.03 ng/mL (ref <0.03)

## 2016-03-01 LAB — TSH: TSH: 5.819 u[IU]/mL — AB (ref 0.350–4.500)

## 2016-03-01 LAB — PROTIME-INR
INR: 2.37
Prothrombin Time: 26.3 seconds — ABNORMAL HIGH (ref 11.4–15.2)

## 2016-03-01 LAB — MAGNESIUM: Magnesium: 1.9 mg/dL (ref 1.7–2.4)

## 2016-03-01 MED ORDER — AMLODIPINE BESYLATE 10 MG PO TABS
10.0000 mg | ORAL_TABLET | Freq: Every day | ORAL | Status: DC
  Administered 2016-03-01: 10 mg via ORAL
  Filled 2016-03-01: qty 1

## 2016-03-01 MED ORDER — BENAZEPRIL HCL 20 MG PO TABS
20.0000 mg | ORAL_TABLET | Freq: Every day | ORAL | Status: DC
  Administered 2016-03-01 – 2016-03-02 (×2): 20 mg via ORAL
  Filled 2016-03-01 (×2): qty 1

## 2016-03-01 MED ORDER — FUROSEMIDE 20 MG PO TABS
20.0000 mg | ORAL_TABLET | Freq: Every day | ORAL | Status: DC
  Administered 2016-03-01 – 2016-03-02 (×2): 20 mg via ORAL
  Filled 2016-03-01 (×2): qty 1

## 2016-03-01 MED ORDER — ZOLPIDEM TARTRATE 5 MG PO TABS
5.0000 mg | ORAL_TABLET | Freq: Every evening | ORAL | Status: DC | PRN
  Administered 2016-03-02: 5 mg via ORAL
  Filled 2016-03-01: qty 1

## 2016-03-01 MED ORDER — ADULT MULTIVITAMIN W/MINERALS CH
1.0000 | ORAL_TABLET | Freq: Every day | ORAL | Status: DC
Start: 2016-03-01 — End: 2016-03-02
  Administered 2016-03-01 – 2016-03-02 (×2): 1 via ORAL
  Filled 2016-03-01 (×2): qty 1

## 2016-03-01 MED ORDER — MAGNESIUM CITRATE PO SOLN: 1.0000 | Freq: Once | ORAL | Status: DC | PRN

## 2016-03-01 MED ORDER — WARFARIN SODIUM 2.5 MG PO TABS
4.5000 mg | ORAL_TABLET | ORAL | Status: DC
  Administered 2016-03-01: 4.5 mg via ORAL
  Filled 2016-03-01: qty 2

## 2016-03-01 MED ORDER — MAGNESIUM SULFATE IN D5W 1-5 GM/100ML-% IV SOLN
1.0000 g | Freq: Once | INTRAVENOUS | Status: AC
  Administered 2016-03-01: 1 g via INTRAVENOUS
  Filled 2016-03-01: qty 100

## 2016-03-01 MED ORDER — OCUVITE-LUTEIN PO CAPS
1.0000 | ORAL_CAPSULE | Freq: Two times a day (BID) | ORAL | Status: DC
  Administered 2016-03-01 – 2016-03-02 (×3): 1 via ORAL
  Filled 2016-03-01 (×3): qty 1

## 2016-03-01 MED ORDER — INSULIN ASPART 100 UNIT/ML ~~LOC~~ SOLN
0.0000 [IU] | Freq: Three times a day (TID) | SUBCUTANEOUS | Status: DC
  Administered 2016-03-01 (×2): 7 [IU] via SUBCUTANEOUS
  Administered 2016-03-01 – 2016-03-02 (×2): 4 [IU] via SUBCUTANEOUS
  Administered 2016-03-02: 11 [IU] via SUBCUTANEOUS
  Filled 2016-03-01: qty 4
  Filled 2016-03-01: qty 7
  Filled 2016-03-01: qty 4
  Filled 2016-03-01: qty 7
  Filled 2016-03-01: qty 11

## 2016-03-01 MED ORDER — SENNOSIDES-DOCUSATE SODIUM 8.6-50 MG PO TABS: 1.0000 | ORAL_TABLET | Freq: Every evening | ORAL | Status: DC | PRN

## 2016-03-01 MED ORDER — ACETAMINOPHEN 650 MG RE SUPP: 650.0000 mg | Freq: Four times a day (QID) | RECTAL | Status: DC | PRN

## 2016-03-01 MED ORDER — WARFARIN - PHARMACIST DOSING INPATIENT
Freq: Every day | Status: DC
  Administered 2016-03-01: 12:00:00

## 2016-03-01 MED ORDER — WARFARIN SODIUM 1 MG PO TABS: 3.0000 mg | ORAL_TABLET | ORAL | Status: DC

## 2016-03-01 MED ORDER — INSULIN GLARGINE 100 UNIT/ML ~~LOC~~ SOLN
52.0000 [IU] | Freq: Every day | SUBCUTANEOUS | Status: DC
  Administered 2016-03-01 (×2): 52 [IU] via SUBCUTANEOUS
  Filled 2016-03-01 (×3): qty 0.52

## 2016-03-01 MED ORDER — ONDANSETRON HCL 4 MG PO TABS: 4.0000 mg | ORAL_TABLET | Freq: Four times a day (QID) | ORAL | Status: DC | PRN

## 2016-03-01 MED ORDER — ACETAMINOPHEN 325 MG PO TABS: 650.0000 mg | ORAL_TABLET | Freq: Four times a day (QID) | ORAL | Status: DC | PRN

## 2016-03-01 MED ORDER — DILTIAZEM HCL 60 MG PO TABS
60.0000 mg | ORAL_TABLET | Freq: Four times a day (QID) | ORAL | Status: DC
  Administered 2016-03-01 – 2016-03-02 (×4): 60 mg via ORAL
  Filled 2016-03-01 (×4): qty 1

## 2016-03-01 MED ORDER — ONDANSETRON HCL 4 MG/2ML IJ SOLN: 4.0000 mg | Freq: Four times a day (QID) | INTRAMUSCULAR | Status: DC | PRN

## 2016-03-01 MED ORDER — SODIUM CHLORIDE 0.9 % IV SOLN
INTRAVENOUS | Status: DC
  Administered 2016-03-01: 01:00:00 via INTRAVENOUS

## 2016-03-01 MED ORDER — VITAMIN D 1000 UNITS PO TABS
1000.0000 [IU] | ORAL_TABLET | Freq: Every day | ORAL | Status: DC
Start: 2016-03-01 — End: 2016-03-02
  Administered 2016-03-01 – 2016-03-02 (×2): 1000 [IU] via ORAL
  Filled 2016-03-01 (×2): qty 1

## 2016-03-01 MED ORDER — TAMSULOSIN HCL 0.4 MG PO CAPS
0.4000 mg | ORAL_CAPSULE | Freq: Every day | ORAL | Status: DC
  Administered 2016-03-01: 0.4 mg via ORAL
  Filled 2016-03-01 (×2): qty 1

## 2016-03-01 MED ORDER — INSULIN ASPART 100 UNIT/ML ~~LOC~~ SOLN
0.0000 [IU] | Freq: Every day | SUBCUTANEOUS | Status: DC
  Administered 2016-03-01 (×2): 2 [IU] via SUBCUTANEOUS
  Filled 2016-03-01 (×2): qty 2

## 2016-03-01 MED ORDER — CLOPIDOGREL BISULFATE 75 MG PO TABS
75.0000 mg | ORAL_TABLET | Freq: Every day | ORAL | Status: DC
  Administered 2016-03-01 – 2016-03-02 (×2): 75 mg via ORAL
  Filled 2016-03-01 (×2): qty 1

## 2016-03-01 MED ORDER — ATORVASTATIN CALCIUM 20 MG PO TABS
80.0000 mg | ORAL_TABLET | Freq: Every day | ORAL | Status: DC
  Administered 2016-03-01: 80 mg via ORAL
  Filled 2016-03-01: qty 4

## 2016-03-01 MED ORDER — BISACODYL 5 MG PO TBEC: 5.0000 mg | DELAYED_RELEASE_TABLET | Freq: Every day | ORAL | Status: DC | PRN

## 2016-03-01 MED ORDER — SODIUM CHLORIDE 0.9% FLUSH
3.0000 mL | Freq: Two times a day (BID) | INTRAVENOUS | Status: DC
  Administered 2016-03-01 – 2016-03-02 (×2): 3 mL via INTRAVENOUS

## 2016-03-01 MED ORDER — HYDROCODONE-ACETAMINOPHEN 5-325 MG PO TABS: 1.0000 | ORAL_TABLET | ORAL | Status: DC | PRN

## 2016-03-01 MED ORDER — ATENOLOL 25 MG PO TABS
25.0000 mg | ORAL_TABLET | Freq: Two times a day (BID) | ORAL | Status: DC
  Administered 2016-03-01 – 2016-03-02 (×4): 25 mg via ORAL
  Filled 2016-03-01 (×4): qty 1

## 2016-03-01 NOTE — Progress Notes (Signed)
ANTICOAGULATION CONSULT NOTE - Initial Consult  Pharmacy Consult for warfarin Indication: AF  No Known Allergies  Patient Measurements: Height: 5\' 9"  (175.3 cm) Weight: 287 lb (130.2 kg) IBW/kg (Calculated) : 70.7 Heparin Dosing Weight:   Vital Signs: Temp: 98.1 F (36.7 C) (12/07 0006) Temp Source: Oral (12/07 0006) BP: 150/90 (12/07 0006) Pulse Rate: 116 (12/07 0006)  Labs:  Recent Labs  02/29/16 1718  HGB 12.9*  HCT 36.8*  PLT 186  LABPROT 27.4*  INR 2.49  CREATININE 1.06  TROPONINI <0.03    Estimated Creatinine Clearance: 90.4 mL/min (by C-G formula based on SCr of 1.06 mg/dL).   Medical History: Past Medical History:  Diagnosis Date  . Atrial fibrillation (HCC)   . Colon polyps   . Diabetes (HCC)   . Hyperlipemia   . Hypertension     Medications:  Infusions:  . sodium chloride    . diltiazem (CARDIZEM) infusion 15 mg/hr (02/29/16 1853)    Assessment: 65 yom cc tachycardia. VKA PTA for AF, INR therapeutic. Will resume home dose.  Goal of Therapy:  INR 2-3 Monitor platelets by anticoagulation protocol: Yes   Plan:  Warfarin 3 mg po every Monday, Tuesday, Wednesday, Friday, Saturday and Sunday, and 4.5 mg po every Thursday. Will schedule PT/INR daily for now and make sure patient is stable, then check less frequently.  Carola FrostNathan A Nakea Gouger, Pharm.D., BCPS Clinical Pharmacist 03/01/2016,12:10 AM

## 2016-03-01 NOTE — Progress Notes (Signed)
[facility] [place] [facility]   PATIENT NAME: Kenneth Hahn    MR#:  [id number]  DATE OF BIRTH:  06/01/1950  SUBJECTIVE:  CHIEF COMPLAINT:   Chief Complaint  Patient presents with  . Tachycardia   Complaint on Cardizem drip. REVIEW OF SYSTEMS:  Review of Systems  Constitutional: Negative for chills, fever and malaise/fatigue.  HENT: Negative for congestion and nosebleeds.   Eyes: Negative for blurred vision and double vision.  Respiratory: Negative for cough, shortness of breath and stridor.   Cardiovascular: Negative for chest pain and leg swelling.  Gastrointestinal: Negative for abdominal pain, blood in stool, diarrhea, melena, nausea and vomiting.  Genitourinary: Negative for dysuria and hematuria.  Musculoskeletal: Negative for joint pain.  Skin: Negative for rash.  Neurological: Negative for dizziness, focal weakness, loss of consciousness and weakness.  Psychiatric/Behavioral: Negative for depression. The patient is not nervous/anxious.     DRUG ALLERGIES:  No Known Allergies VITALS:  Blood pressure 130/73, pulse 63, temperature 98.4 F (36.9 C), temperature source Oral, resp. rate 18, height 5\' 9"  (1.753 m), weight 289 lb 11.2 oz (131.4 kg), SpO2 93 %. PHYSICAL EXAMINATION:  Physical Exam  Constitutional: He is oriented to person, place, and time. No distress.  Morbid obesity.  HENT:  Head: Normocephalic.  Mouth/Throat: Oropharynx is clear and moist.  Eyes: Conjunctivae and EOM are normal. No scleral icterus.  Neck: Normal range of motion. Neck supple. No JVD present. No tracheal deviation present.  Cardiovascular: Normal rate and normal heart sounds.  Exam reveals no gallop.   No murmur heard. Irregular rhythm  Pulmonary/Chest: Effort normal and breath sounds normal. No respiratory distress. He has no wheezes. He has no rales.  Abdominal: Soft. Bowel sounds are normal. He exhibits no distension. There is no tenderness.    Musculoskeletal: Normal range of motion. He exhibits no edema or tenderness.  Neurological: He is alert and oriented to person, place, and time. No cranial nerve deficit.  Skin: No rash noted. No erythema.  Psychiatric: Affect and judgment normal.   LABORATORY PANEL:   CBC  Recent Labs Lab 03/01/16 0544  WBC 7.2  HGB 11.4*  HCT 32.8*  PLT 169   ------------------------------------------------------------------------------------------------------------------ Chemistries   Recent Labs Lab 03/01/16 0544  NA 140  K 3.6  CL 105  CO2 27  GLUCOSE 184*  BUN 11  CREATININE 1.08  CALCIUM 8.3*  MG 1.9   RADIOLOGY:  Dg Chest 2 View  Result Date: 02/29/2016 CLINICAL DATA:  Chest pain.  Irregular heart rate. EXAM: CHEST  2 VIEW COMPARISON:  None. FINDINGS: The cardiomediastinal contours are normal. The lungs are clear. Pulmonary vasculature is normal. No consolidation, pleural effusion, or pneumothorax. No acute osseous abnormalities are seen. Degenerative change in the thoracic spine. IMPRESSION: No acute pulmonary process. Electronically Signed   By: Rubye OaksMelanie  Ehinger M.D.   On: 02/29/2016 19:27   ASSESSMENT AND PLAN:   This is a 65 y.o. male with a history of  Coronary artery disease status post stent on 01/04/16, chronic atrial fibrillation on Coumadin, diabetes, hypertension and hyperlipidemia now being admitted with:  1. Chronic Atrial fibrillation with rapid ventricular response Still on IV Cardizem drip for persistent tachycardia in the 140s despite Cardizem and Lopressor. Patient had a complete echo on 12/26/15 with mild LV systolic dysfunction with an EF of 40%, moderate to severe mitral insufficiency, moderate tricuspid insufficiency and grade 2 diastolic dysfunction. continue aspirin, Plavix and Coumadin. INR is therapeutic. Convert IV Cardizem to  by mouth per Dr. Darrold JunkerParaschos, Cardiology consultation.  2. Hypomagnesemia - replace IV and improved. 3. History of  diabetes-continue Lantus, cover with regular insulin siding scale coverage 4. History of hypertension-continue Norvasc, atenolol, benazepril, Lasix 5. History of hyperlipidemia-continue Lipitor 6. History of BPH-continue Flomax  All the records are reviewed and case discussed with Care Management/Social Worker. Management plans discussed with the patient, family and they are in agreement.  CODE STATUS: Full code  TOTAL TIME TAKING CARE OF THIS PATIENT: 36 minutes.   More than 50% of the time was spent in counseling/coordination of care: YES  POSSIBLE D/C IN 1-2 DAYS, DEPENDING ON CLINICAL CONDITION.   [name], [name] M.D on 03/01/2016 at 5:37 PM  Between 7am to 6pm - Pager - [phone]  After 6pm go to [email or web address] - Social research officer, [email or web address] [email or web address] [facility]  [facility] [place] [facility]  Office  [phone]  CC: Primary care physician; WHITE, Arlyss RepressELIZABETH BURNEY, NP  Note: This dictation was prepared with Dragon dictation along with smaller phrase technology. Any transcriptional errors that result from this process are unintentional.

## 2016-03-01 NOTE — H&P (Signed)
[facility] - [place] [facility] Admission History and Physical Kenneth RoyaltyAlexis Ione Hahn, D.O.  ---------------------------------------------------------------------------------------------------------------------   PATIENT NAME: Kenneth OhmsJoseph Hahn MR#: [id number] DATE OF BIRTH: 12/24/1950 DATE OF ADMISSION: 02/29/2016 PRIMARY CARE PHYSICIAN: WHITE, Arlyss RepressELIZABETH BURNEY, NP  REQUESTING/REFERRING PHYSICIAN: ED Dr. Huel CoteQuigley  CHIEF COMPLAINT: Chief Complaint  Patient presents with  . Tachycardia    HISTORY OF PRESENT ILLNESS: Kenneth Hahn is a 65 y.o. male with a known history of Coronary artery disease status post stent on 01/04/16, chronic atrial fibrillation on Coumadin, diabetes, hypertension and hyperlipidemia for evaluation of tachycardia. Patient attended cardiac rehabilitation as morning but on his arrival was found to have a heart rate in the 140s and was therefore referred to the emergency department for evaluation. His vital signs during his last cardiac rehabilitation session 2 days ago were normal. Patient states that he had a cardiac catheterization with stent placed on October 17 and is taking Coumadin and Plavix. He denies any symptoms associated with this tachycardia. He has no chest pain, shortness of breath, palpitations, nausea, vomiting, dizziness.  Of note he states the only real significant change in his regimen is that he had Honey baked ham soup which was high in sugar and salt.  Otherwise there has been no change in status. Patient has been taking medication as prescribed and there has been no recent change in medication or diet.  There has been no recent illness, travel or sick contacts.    Patient denies fevers/chills, weakness, dizziness, chest pain, shortness of breath, N/V/C/D, abdominal pain, dysuria/frequency, changes in mental status.   EMS/ED COURSE:   Patient received Cardizem drip and Lopressor  PAST MEDICAL HISTORY: Past Medical History:  Diagnosis Date  .  Atrial fibrillation (HCC)   . Colon polyps   . Diabetes (HCC)   . Hyperlipemia   . Hypertension   Coronary artery disease with stent 01/04/16    PAST SURGICAL HISTORY: Past Surgical History:  Procedure Laterality Date  . CARDIAC CATHETERIZATION Left 01/04/2016   Procedure: Left Heart Cath and Coronary Angiography;  Surgeon: Lamar BlinksBruce J Kowalski, MD;  Location: [facility] INVASIVE CV LAB;  Service: Cardiovascular;  Laterality: Left;  . CARDIAC CATHETERIZATION N/A 01/04/2016   Procedure: Coronary Stent Intervention;  Surgeon: Marcina MillardAlexander Paraschos, MD;  Location: [facility] INVASIVE CV LAB;  Service: Cardiovascular;  Laterality: N/A;  . CARPAL TUNNEL RELEASE     right hand  . ROTATOR CUFF REPAIR     right shoulder  . UMBILICAL HERNIA REPAIR        SOCIAL HISTORY: Social History  Substance Use Topics  . Smoking status: Former Smoker    Quit date: 03/26/1973  . Smokeless tobacco: Never Used  . Alcohol use Yes     Comment: 2 drinks a day      FAMILY HISTORY: Family History  Problem Relation Age of Onset  . Adopted: Yes  . Diabetes Father   . Heart disease Father   . Squamous cell carcinoma Sister   . Lymphoma Mother   Father with coronary artery disease, diabetes, hypertension, hyperlipidemia and heart disease. Mother with glaucoma, depression. Sister with skin cancer and lung cancer.   MEDICATIONS AT HOME: Prior to Admission medications   Medication Sig Start Date End Date Taking? Authorizing Provider  acetaminophen (TYLENOL) 500 MG tablet Take 1,000 mg by mouth daily.   Yes Historical Provider, MD  amLODipine (NORVASC) 10 MG tablet Take 10 mg by mouth daily.   Yes Historical Provider, MD  atenolol (TENORMIN) 25 MG tablet Take 25  mg by mouth 2 (two) times daily. Take one tablet morning and evening   Yes Historical Provider, MD  atorvastatin (LIPITOR) 80 MG tablet Take 1 tablet (80 mg total) by mouth daily at 6 PM. 01/04/16  Yes Lamar Blinks, MD  benazepril (LOTENSIN) 20 MG tablet Take  20 mg by mouth daily.   Yes Historical Provider, MD  Cholecalciferol (VITAMIN D-3) 1000 units CAPS Take 1 capsule by mouth daily.    Yes Historical Provider, MD  clopidogrel (PLAVIX) 75 MG tablet Take 1 tablet (75 mg total) by mouth daily with breakfast. 01/05/16  Yes Lamar Blinks, MD  insulin glargine (LANTUS) 100 UNIT/ML injection Inject 0.62 mLs (62 Units total) into the skin at bedtime. Patient taking differently: Inject 52 Units into the skin at bedtime.  01/04/16  Yes Lamar Blinks, MD  metFORMIN (GLUCOPHAGE-XR) 500 MG 24 hr tablet Take 500 mg by mouth every evening.   Yes Historical Provider, MD  Multiple Vitamin (MULTIVITAMIN) tablet Take 1 tablet by mouth daily.   Yes Historical Provider, MD  Multiple Vitamins-Minerals (PRESERVISION AREDS 2+MULTI VIT) CAPS Take 1 capsule by mouth 2 (two) times daily.   Yes Historical Provider, MD  tamsulosin (FLOMAX) 0.4 MG CAPS capsule Take 0.4 mg by mouth at bedtime. 09/29/15 09/28/16 Yes Historical Provider, MD  warfarin (COUMADIN) 3 MG tablet Take 3-4.5 mg by mouth one time only at 6 PM. Take 3mg  every day, except Thursday, take 4.5mg    Yes Historical Provider, MD  furosemide (LASIX) 20 MG tablet Take 20 mg by mouth daily. 11/23/15 11/22/16  Historical Provider, MD  ondansetron (ZOFRAN) 4 MG/2ML SOLN injection Inject 2 mLs (4 mg total) into the vein every 6 (six) hours as needed for nausea. Patient not taking: Reported on 01/16/2016 01/04/16   Lamar Blinks, MD      DRUG ALLERGIES: No Known Allergies   REVIEW OF SYSTEMS: CONSTITUTIONAL: No fatigue, weakness, fever, chills, weight gain/loss, headache EYES: No blurry or double vision. ENT: No tinnitus, postnasal drip, redness or soreness of the oropharynx. RESPIRATORY: No dyspnea, cough, wheeze, hemoptysis. CARDIOVASCULAR: No chest pain, orthopnea, palpitations, syncope. GASTROINTESTINAL: No nausea, vomiting, constipation, diarrhea, abdominal pain. No hematemesis, melena or  hematochezia. GENITOURINARY: No dysuria, frequency, hematuria. ENDOCRINE: No polyuria or nocturia. No heat or cold intolerance. HEMATOLOGY: No anemia, bruising, bleeding. INTEGUMENTARY: No rashes, ulcers, lesions. MUSCULOSKELETAL: No pain, arthritis, swelling, gout. NEUROLOGIC: No numbness, tingling, weakness or ataxia. No seizure-type activity. PSYCHIATRIC: No anxiety, depression, insomnia.  PHYSICAL EXAMINATION: VITAL SIGNS: Blood pressure (!) 150/90, pulse (!) 116, temperature 98.1 F (36.7 C), temperature source Oral, resp. rate 18, height 5\' 9"  (1.753 m), weight 131.4 kg (289 lb 11.2 oz), SpO2 96 %.  GENERAL: 65 y.o.-year-old White male patient, well-developed, well-nourished lying in the bed in no acute distress.  Pleasant and cooperative.   HEENT: Head atraumatic, normocephalic. Pupils equal, round, reactive to light and accommodation. No scleral icterus. Extraocular muscles intact. Oropharynx is clear. Mucus membranes moist. NECK: Supple, full range of motion. No JVD, no bruit heard. No cervical lymphadenopathy. CHEST: Normal breath sounds bilaterally. No wheezing, rales, rhonchi or crackles. No use of accessory muscles of respiration.  No reproducible chest wall tenderness.  CARDIOVASCULAR: Irregularly irregular S1, S2 normal. No murmurs, rubs, or gallops appreciated. Cap refill <2 seconds. ABDOMEN: Soft, nontender, nondistended. No rebound, guarding, rigidity. Normoactive bowel sounds present in all four quadrants. No organomegaly or mass. EXTREMITIES:  No pedal edema, cyanosis, or clubbing. NEUROLOGIC: Cranial nerves II through XII are  grossly intact with no focal sensorimotor deficit. Muscle strength 5/5 in all extremities. Sensation intact. Gait not checked. PSYCHIATRIC: The patient is alert and oriented x 3. Normal affect, mood, thought content. SKIN: Warm, dry, and intact without obvious rash, lesion, or ulcer.  LABORATORY PANEL:  CBC  Recent Labs Lab 02/29/16 1718  WBC  8.3  HGB 12.9*  HCT 36.8*  PLT 186   ----------------------------------------------------------------------------------------------------------------- Chemistries  Recent Labs Lab 02/29/16 1718 02/29/16 1720  NA 137  --   K 3.5  --   CL 100*  --   CO2 24  --   GLUCOSE 169*  --   BUN 13  --   CREATININE 1.06  --   CALCIUM 8.7*  --   MG  --  1.5*   ------------------------------------------------------------------------------------------------------------------ Cardiac Enzymes  Recent Labs Lab 02/29/16 1718  TROPONINI <0.03   ------------------------------------------------------------------------------------------------------------------  RADIOLOGY: Dg Chest 2 View  Result Date: 02/29/2016 CLINICAL DATA:  Chest pain.  Irregular heart rate. EXAM: CHEST  2 VIEW COMPARISON:  None. FINDINGS: The cardiomediastinal contours are normal. The lungs are clear. Pulmonary vasculature is normal. No consolidation, pleural effusion, or pneumothorax. No acute osseous abnormalities are seen. Degenerative change in the thoracic spine. IMPRESSION: No acute pulmonary process. Electronically Signed   By: Rubye OaksMelanie  Ehinger M.D.   On: 02/29/2016 19:27    EKG: Atrial fibrillation at 132 bpm with normal axis and nonspecific ST-T wave changes.   IMPRESSION AND PLAN:  This is a 65 y.o. male with a history of  Coronary artery disease status post stent on 01/04/16, chronic atrial fibrillation on Coumadin, diabetes, hypertension and hyperlipidemia now being admitted with:  1. Chronic Atrial fibrillation with rapid ventricular response-patient is being admitted to observation with telemetry monitoring for IV Cardizem drip given his persistent tachycardia in the 140s despite Cardizem and Lopressor. Patient had a complete echo on 12/26/15 with mild LV systolic dysfunction with an EF of 40%, moderate to severe mitral insufficiency, moderate tricuspid insufficiency and grade 2 diastolic dysfunction. -We'll  continue aspirin, Plavix and Coumadin. INR is therapeutic. -Cardiology consultation. Patient follows with Dr. Gwen PoundsKowalski who performed his catheterization in October. 2. Hypomagnesemia - replace IV and recheck level in AM.  3. History of diabetes-continue Lantus, cover with regular insulin siding scale coverage 4. History of hypertension-continue Norvasc, atenolol, benazepril, Lasix 5. History of hyperlipidemia-continue Lipitor 6. History of BPH-continue Flomax  Diet/Nutrition: Nothing by mouth after midnight for consideration of interventional procedure Fluids: Hep-Lock DVT Px: Coumadin, SCDs and early ambulation Code Status: Full  All the records are reviewed and case discussed with ED provider. Management plans discussed with the patient and/or family who express understanding and agree with plan of care.   TOTAL TIME TAKING CARE OF THIS PATIENT: 60 minutes.   Phoenix Dresser D.O. on 03/01/2016 at 1:46 AM Between 7am to 6pm - Pager - [phone] After 6pm go to [email or web address] - Social research officer, governmentpassword EPAS [facility] [place] [facility] Office [phone] CC: Primary care physician; WHITE, Arlyss RepressELIZABETH BURNEY, NP     Note: This dictation was prepared with Dragon dictation along with smaller phrase technology. Any transcriptional errors that result from this process are unintentional.

## 2016-03-01 NOTE — Care Management (Signed)
Patient admitted with chronic atrial fibrillation with rvr.  Placed on cardizem drip.  Currently participating in [facility] cardiac [facility]. Independent in all adls, denies issues accessing medical care, obtaining medications or with transportation.  Current with cardiologist and PCP.

## 2016-03-01 NOTE — Consult Note (Signed)
[facility]  CARDIOLOGY CONSULT NOTE  Patient ID: Kenneth Hahn MRN: [id number] DOB/AGE: 67/08/1950 67 y.o.  Admit date: 02/29/2016 Referring Physician Hugelmeyer Primary Physician Doristine MangoElizabeth White Primary Cardiologist Gwen PoundsKowalski Reason for Consultation Atrial fibrillation  HPI: 65 year old gentleman referred for evaluation of atrial fibrillation. The patient has known coronary artery disease, with history of chronic atrial fibrillation on chronic Coumadin therapy for stroke prevention. He recently underwent DES of mid LAD 01/04/2016. The patient was a cardiac rehabilitation when he was noted to be tachycardic and [facility]. Patient was noted to be in atrial fibrillation with a rapid ventricular rate and started on Cardizem IV infusion. The patient denies chest pain or shortness of breath. Heart rate has improved. The patient ruled out for myocardial infarction with troponin less than 0.03.  Review of systems complete and found to be negative unless listed above     Past Medical History:  Diagnosis Date  . Atrial fibrillation (HCC)   . Colon polyps   . Diabetes (HCC)   . Hyperlipemia   . Hypertension     Past Surgical History:  Procedure Laterality Date  . CARDIAC CATHETERIZATION Left 01/04/2016   Procedure: Left Heart Cath and Coronary Angiography;  Surgeon: Lamar BlinksBruce J Kowalski, MD;  Location: [facility] INVASIVE CV LAB;  Service: Cardiovascular;  Laterality: Left;  . CARDIAC CATHETERIZATION N/A 01/04/2016   Procedure: Coronary Stent Intervention;  Surgeon: Marcina MillardAlexander Siena Poehler, MD;  Location: [facility] INVASIVE CV LAB;  Service: Cardiovascular;  Laterality: N/A;  . CARPAL TUNNEL RELEASE     right hand  . ROTATOR CUFF REPAIR     right shoulder  . UMBILICAL HERNIA REPAIR      Prescriptions Prior to Admission  Medication Sig Dispense Refill Last Dose  . acetaminophen (TYLENOL) 500 MG tablet Take 1,000 mg by mouth daily.     Marland Kitchen. amLODipine (NORVASC) 10 MG tablet Take  10 mg by mouth daily.   02/29/2016 at 1030  . atenolol (TENORMIN) 25 MG tablet Take 25 mg by mouth 2 (two) times daily. Take one tablet morning and evening   02/29/2016 at 1030  . atorvastatin (LIPITOR) 80 MG tablet Take 1 tablet (80 mg total) by mouth daily at 6 PM. 90 tablet 4 02/29/2016 at 1030  . benazepril (LOTENSIN) 20 MG tablet Take 20 mg by mouth daily.   02/29/2016 at 1030  . Cholecalciferol (VITAMIN D-3) 1000 units CAPS Take 1 capsule by mouth daily.    02/29/2016 at 1030  . clopidogrel (PLAVIX) 75 MG tablet Take 1 tablet (75 mg total) by mouth daily with breakfast. 90 tablet 4 02/29/2016 at 1030  . insulin glargine (LANTUS) 100 UNIT/ML injection Inject 0.62 mLs (62 Units total) into the skin at bedtime. (Patient taking differently: Inject 52 Units into the skin at bedtime. ) 10 mL 11 02/28/2016 at 2000  . metFORMIN (GLUCOPHAGE-XR) 500 MG 24 hr tablet Take 500 mg by mouth every evening.   02/28/2016 at Unknown time  . Multiple Vitamin (MULTIVITAMIN) tablet Take 1 tablet by mouth daily.   02/29/2016 at 1030  . Multiple Vitamins-Minerals (PRESERVISION AREDS 2+MULTI VIT) CAPS Take 1 capsule by mouth 2 (two) times daily.   02/29/2016 at 1030  . tamsulosin (FLOMAX) 0.4 MG CAPS capsule Take 0.4 mg by mouth at bedtime.   02/28/2016 at Unknown time  . warfarin (COUMADIN) 3 MG tablet Take 3-4.5 mg by mouth one time only at 6 PM. Take 3mg  every day, except Thursday, take 4.5mg    02/28/2016 at  2000  . furosemide (LASIX) 20 MG tablet Take 20 mg by mouth daily.   Taking  . ondansetron (ZOFRAN) 4 MG/2ML SOLN injection Inject 2 mLs (4 mg total) into the vein every 6 (six) hours as needed for nausea. (Patient not taking: Reported on 01/16/2016) 2 mL 0 Not Taking   Social History   Social History  . Marital status: Married    Spouse name: N/A  . Number of children: 2  . Years of education: N/A   Occupational History  .  [employer]   Social History Main Topics  . Smoking status: Former Smoker     Quit date: 03/26/1973  . Smokeless tobacco: Never Used  . Alcohol use Yes     Comment: 2 drinks a day  . Drug use: No  . Sexual activity: Yes   Other Topics Concern  . Not on file   Social History Narrative  . No narrative on file    Family History  Problem Relation Age of Onset  . Adopted: Yes  . Diabetes Father   . Heart disease Father   . Squamous cell carcinoma Sister   . Lymphoma Mother       Review of systems complete and found to be negative unless listed above      PHYSICAL EXAM  General: Well developed, well nourished, in no acute distress HEENT:  Normocephalic and atramatic Neck:  No JVD.  Lungs: Clear bilaterally to auscultation and percussion. Heart: HRRR . Normal S1 and S2 without gallops or murmurs.  Abdomen: Bowel sounds are positive, abdomen soft and non-tender  Msk:  Back normal, normal gait. Normal strength and tone for age. Extremities: No clubbing, cyanosis or edema.   Neuro: Alert and oriented X 3. Psych:  Good affect, responds appropriately  Labs:   Lab Results  Component Value Date   WBC 7.2 03/01/2016   HGB 11.4 (L) 03/01/2016   HCT 32.8 (L) 03/01/2016   MCV 93.2 03/01/2016   PLT 169 03/01/2016    Recent Labs Lab 03/01/16 0544  NA 140  K 3.6  CL 105  CO2 27  BUN 11  CREATININE 1.08  CALCIUM 8.3*  GLUCOSE 184*   Lab Results  Component Value Date   TROPONINI <0.03 03/01/2016   No results found for: CHOL No results found for: HDL No results found for: LDLCALC No results found for: TRIG No results found for: CHOLHDL No results found for: LDLDIRECT    Radiology: Dg Chest 2 View  Result Date: 02/29/2016 CLINICAL DATA:  Chest pain.  Irregular heart rate. EXAM: CHEST  2 VIEW COMPARISON:  None. FINDINGS: The cardiomediastinal contours are normal. The lungs are clear. Pulmonary vasculature is normal. No consolidation, pleural effusion, or pneumothorax. No acute osseous abnormalities are seen. Degenerative change in the thoracic  spine. IMPRESSION: No acute pulmonary process. Electronically Signed   By: Rubye OaksMelanie  Ehinger M.D.   On: 02/29/2016 19:27    EKG: Atrial fibrillation  ASSESSMENT AND PLAN:   1. Atrial fibrillation with a rapid ventricular rate, rate improved on Cardizem drip, chads Vasc of 3, on Coumadin for stroke prevention 2. CAD, status post DES mid LAD, currently without chest pain, troponin negative  Recommendations  1. Continue Coumadin for stroke prevention 2. Convert IV Cardizem to by mouth 3. No further cardiac diagnostics at this time   Signed: Marcina Millardlexander Miyu Fenderson MD,PhD, [facility] 03/01/2016, 1:24 PM

## 2016-03-02 LAB — PROTIME-INR
INR: 2.24
PROTHROMBIN TIME: 25.2 s — AB (ref 11.4–15.2)

## 2016-03-02 LAB — GLUCOSE, CAPILLARY
GLUCOSE-CAPILLARY: 176 mg/dL — AB (ref 65–99)
Glucose-Capillary: 295 mg/dL — ABNORMAL HIGH (ref 65–99)

## 2016-03-02 MED ORDER — DILTIAZEM HCL ER COATED BEADS 240 MG PO CP24
240.0000 mg | ORAL_CAPSULE | Freq: Every day | ORAL | Status: DC
  Administered 2016-03-02: 240 mg via ORAL
  Filled 2016-03-02: qty 1

## 2016-03-02 MED ORDER — DILTIAZEM HCL ER COATED BEADS 240 MG PO CP24: 240.0000 mg | ORAL_CAPSULE | Freq: Every day | ORAL | 2 refills | Status: AC

## 2016-03-02 NOTE — Progress Notes (Signed)
SATURATION QUALIFICATIONS: (This note is used to comply with regulatory documentation for home oxygen)  Patient Saturations on Room Air at Rest = 91%  Patient Saturations on Room Air while Ambulating = 88%  Patient Saturations on 2Liters of oxygen while Ambulating = 93%  Please briefly explain why patient needs home oxygen: sob with ambulation

## 2016-03-02 NOTE — Plan of Care (Signed)
Problem: Cardiac: Goal: Ability to achieve and maintain adequate cardiopulmonary perfusion will improve Outcome: Progressing Afib with controlled rates continue.  No complaints of pain or shortness of breath.

## 2016-03-02 NOTE — Discharge Instructions (Signed)
Heart healthy and ADA diet. Hypoxia, he needs home O2 [place] 1-2L.

## 2016-03-02 NOTE — Progress Notes (Signed)
Discharge instructions along with home medication list and follow up gone over with patient and wife. Both verbalized that they understood instructions. Printed rx given to patient. Waiting for 02 to be delivered, will removed iv and telemetry once o2 arrives. Patient will be discharged home.

## 2016-03-02 NOTE — Progress Notes (Signed)
Inpatient Diabetes Program Recommendations  AACE/ADA: New Consensus Statement on Inpatient Glycemic Control (2015)  Target Ranges:  Prepandial:   less than 140 mg/dL      Peak postprandial:   less than 180 mg/dL (1-2 hours)      Critically ill patients:  140 - 180 mg/dL   Results for Kenneth Hahn, Kenneth Hahn (MRN [id number]) as of 03/02/2016 08:17  Ref. Range 03/01/2016 07:50 03/01/2016 11:22 03/01/2016 16:22 03/01/2016 20:57 03/02/2016 07:45  Glucose-Capillary Latest Ref Range: 65 - 99 mg/dL 782169 (H) 956204 (H) 213242 (H) 231 (H) 176 (H)   Review of Glycemic Control  Diabetes history: DM2 Outpatient Diabetes medications: Lantus 52 units QHS, Metformin XR 500 mg QPM Current orders for Inpatient glycemic control: Lantus 52 units QHS, Novolog 0-20 units TID with meals, Novolog 0-5 units QHS  Inpatient Diabetes Program Recommendations: Insulin - Meal Coverage: Post prandial glucose is consistently elevated. Please consider ordering Novolog 8 units TID with meals for meal coverage if patient eats at least 50% of meals. HgbA1C: Please add on an A1C to blood in lab if avilable or draw with next scheduled blood draw to evaluate glycemic control over the past 2-3 months.  Thanks, Orlando PennerMarie Acxel Dingee, RN, MSN, CDE Diabetes Coordinator Inpatient Diabetes Program [phone] (Team Pager from 8am to 5pm)

## 2016-03-02 NOTE — Discharge Summary (Addendum)
[facility] - [place] [facility]   PATIENT NAME: Kenneth Hahn    MR#:  [id number]  DATE OF BIRTH:  05/26/1950  DATE OF ADMISSION:  02/29/2016   ADMITTING PHYSICIAN: Tonye RoyaltyAlexis Hugelmeyer, DO  DATE OF DISCHARGE: 03/02/2016 PRIMARY CARE PHYSICIAN: WHITE, Arlyss RepressELIZABETH BURNEY, NP   ADMISSION DIAGNOSIS:  Atrial fibrillation with RVR (HCC) [I48.91] DISCHARGE DIAGNOSIS:  Active Problems:   Atrial fibrillation with rapid ventricular response (HCC)   A-fib (HCC)  acute on chronic diastolic CHF EF 40%. Hypoxia. SECONDARY DIAGNOSIS:   Past Medical History:  Diagnosis Date  . Atrial fibrillation (HCC)   . Colon polyps   . Diabetes (HCC)   . Hyperlipemia   . Hypertension    HOSPITAL COURSE:  This is a 65 y.o.malewith a history of Coronary artery disease status post stent on 01/04/16, chronic atrial fibrillation on Coumadin, diabetes, hypertension and hyperlipidemianow being admitted with:  1. Chronic Atrial fibrillation with rapid ventricular response He was on IV Cardizem drip for persistent tachycardia in the 140s despite Cardizem and Lopressor. Patient had a complete echo on 12/26/15 with mild LV systolic dysfunction with an EF of 40%, moderate to severe mitral insufficiency, moderate tricuspid insufficiency and grade 2 diastolic dysfunction. continue aspirin, Plavix and Coumadin. INR is therapeutic. Converted IV Cardizem to by mouth per Dr. Darrold JunkerParaschos, Cardiology consultation.  Hypoxia due to acute on chronic diastolic CHF EF 40%. He needs O2 [place] at home. Continue lasix.  2. Hypomagnesemia - replaced IV and improved. 3. History of diabetes-continue Lantus, cover with regular insulin siding scale coverage 4. History of hypertension-continue Norvasc, atenolol, benazepril, Lasix 5. History of hyperlipidemia-continue Lipitor 6. History of BPH-continue Flomax  DISCHARGE CONDITIONS:  Stable, discharge to home today. CONSULTS OBTAINED:  Treatment Team:    Marcina MillardAlexander Paraschos, MD DRUG ALLERGIES:  No Known Allergies DISCHARGE MEDICATIONS:     Medication List    STOP taking these medications   amLODipine 10 MG tablet Commonly known as:  NORVASC     TAKE these medications   acetaminophen 500 MG tablet Commonly known as:  TYLENOL Take 1,000 mg by mouth daily.   atenolol 25 MG tablet Commonly known as:  TENORMIN Take 25 mg by mouth 2 (two) times daily. Take one tablet morning and evening   atorvastatin 80 MG tablet Commonly known as:  LIPITOR Take 1 tablet (80 mg total) by mouth daily at 6 PM.   benazepril 20 MG tablet Commonly known as:  LOTENSIN Take 20 mg by mouth daily.   clopidogrel 75 MG tablet Commonly known as:  PLAVIX Take 1 tablet (75 mg total) by mouth daily with breakfast.   diltiazem 240 MG 24 hr capsule Commonly known as:  CARDIZEM CD Take 1 capsule (240 mg total) by mouth daily.   furosemide 20 MG tablet Commonly known as:  LASIX Take 20 mg by mouth daily.   insulin glargine 100 UNIT/ML injection Commonly known as:  LANTUS Inject 0.62 mLs (62 Units total) into the skin at bedtime. What changed:  how much to take   metFORMIN 500 MG 24 hr tablet Commonly known as:  GLUCOPHAGE-XR Take 500 mg by mouth every evening.   multivitamin tablet Take 1 tablet by mouth daily.   ondansetron 4 MG/2ML Soln injection Commonly known as:  ZOFRAN Inject 2 mLs (4 mg total) into the vein every 6 (six) hours as needed for nausea.   PRESERVISION AREDS 2+MULTI VIT Caps Take 1 capsule by mouth 2 (two) times daily.   tamsulosin  0.4 MG Caps capsule Commonly known as:  FLOMAX Take 0.4 mg by mouth at bedtime.   Vitamin D-3 1000 units Caps Take 1 capsule by mouth daily.   warfarin 3 MG tablet Commonly known as:  COUMADIN Take 3-4.5 mg by mouth one time only at 6 PM. Take 3mg  every day, except Thursday, take 4.5mg             Durable Medical Equipment        Start     Ordered   03/02/16 1126  For home use  only DME oxygen  Once    Question Answer Comment  Mode or (Route) Nasal cannula   Liters per Minute 2   Frequency Continuous (stationary and portable oxygen unit needed)   Oxygen conserving device Yes   Oxygen delivery system Gas      03/02/16 1126       DISCHARGE INSTRUCTIONS:  See AVS OXYGEN:  Home Oxygen: [place] 2L. Oxygen Delivery: yes DISCHARGE LOCATION:    If you experience worsening of your admission symptoms, develop shortness of breath, life threatening emergency, suicidal or homicidal thoughts you must seek medical attention immediately by calling 911 or calling your MD immediately  if symptoms less severe.  You Must read complete instructions/literature along with all the possible adverse reactions/side effects for all the Medicines you take and that have been prescribed to you. Take any new Medicines after you have completely understood and accpet all the possible adverse reactions/side effects.   Please note  You were cared for by a hospitalist during your hospital stay. If you have any questions about your discharge medications or the care you received while you were in the hospital after you are discharged, you can call the unit and asked to speak with the hospitalist on call if the hospitalist that took care of you is not available. Once you are discharged, your primary care physician will handle any further medical issues. Please note that NO REFILLS for any discharge medications will be authorized once you are discharged, as it is imperative that you return to your primary care physician (or establish a relationship with a primary care physician if you do not have one) for your aftercare needs so that they can reassess your need for medications and monitor your lab values.    On the day of Discharge:  VITAL SIGNS:  Blood pressure (!) 138/92, pulse 73, temperature 98.1 F (36.7 C), temperature source Oral, resp. rate 18, height 5\' 9"  (1.753 m), weight 289 lb 11.2 oz  (131.4 kg), SpO2 92 %. PHYSICAL EXAMINATION:  GENERAL:  65 y.o.-year-old patient lying in the bed with no acute distress. Morbidly obese. EYES: Pupils equal, round, reactive to light and accommodation. No scleral icterus. Extraocular muscles intact.  HEENT: Head atraumatic, normocephalic. Oropharynx and nasopharynx clear.  NECK:  Supple, no jugular venous distention. No thyroid enlargement, no tenderness.  LUNGS: Normal breath sounds bilaterally, no wheezing, rales,rhonchi or crepitation. No use of accessory muscles of respiration.  CARDIOVASCULAR: S1, S2 normal. No murmurs, rubs, or gallops.  ABDOMEN: Soft, non-tender, non-distended. Bowel sounds present. No organomegaly or mass.  EXTREMITIES: Bilateral leg edema, no cyanosis, or clubbing.  NEUROLOGIC: Cranial nerves II through XII are intact. Muscle strength 5/5 in all extremities. Sensation intact. Gait not checked.  PSYCHIATRIC: The patient is alert and oriented x 3.  SKIN: No obvious rash, lesion, or ulcer.  DATA REVIEW:   CBC  Recent Labs Lab 03/01/16 0544  WBC 7.2  HGB 11.4*  HCT 32.8*  PLT 169    Chemistries   Recent Labs Lab 03/01/16 0544  NA 140  K 3.6  CL 105  CO2 27  GLUCOSE 184*  BUN 11  CREATININE 1.08  CALCIUM 8.3*  MG 1.9     Microbiology Results  No results found for this or any previous visit.  RADIOLOGY:  No results found.   Management plans discussed with the patient, family and they are in agreement.  CODE STATUS:     Code Status Orders        Start     Ordered   03/01/16 0005  Full code  Continuous     03/01/16 0004    Code Status History    Date Active Date Inactive Code Status Order ID Comments User Context   01/04/2016  9:57 AM 01/05/2016  1:31 PM Full Code [id number]  Marcina MillardAlexander Paraschos, MD Inpatient      TOTAL TIME TAKING CARE OF THIS PATIENT: 33 minutes.    [name], Arihana Ambrocio M.D on 03/02/2016 at 2:04 PM  Between 7am to 6pm - Pager - [phone]  After 6pm go to  [email or web address] - Social research officer, governmentpassword [email or web address]  [facility] [place] [facility]  Office  [phone]  CC: Primary care physician; WHITE, Arlyss RepressELIZABETH BURNEY, NP   Note: This dictation was prepared with Dragon dictation along with smaller phrase technology. Any transcriptional errors that result from this process are unintentional.

## 2016-03-02 NOTE — Care Management (Signed)
Patient has qualified for home oxygen.  He does have grade 2 diastolic dysfunction and left ventricular dysfunction.  Agency preference is [facility]. Obtained order and sent the referral.  Patient declines need for home health nurse to follow.  Does say aver the last few months he has been feeling very fatigued and wonders if it is due to low oxygen levels.  [facility] to deliver portable 02 tank to patient.

## 2016-03-02 NOTE — Progress Notes (Signed)
ANTICOAGULATION CONSULT NOTE - Initial Consult  Pharmacy Consult for warfarin Indication: Atrial fibrillation  No Known Allergies  Patient Measurements: Height: $RemoveBeforeD ID_[id number]$5\' 9"kg (Calculated) : 70.7  Vital Signs: Temp: 98.1 F (36.7 C) (12/08 0800) Temp Source: Oral (12/08 0800) BP: 138/92 (12/08 0800) Pulse Rate: 73 (12/08 0800)  Labs:  Recent Labs  02/29/16 1718 03/01/16 0054 03/01/16 0544 03/01/16 1151 03/02/16 0440  HGB 12.9*  --  11.4*  --   --   HCT 36.8*  --  32.8*  --   --   PLT 186  --  169  --   --   LABPROT 27.4*  --  26.3*  --  25.2*  INR 2.49  --  2.37  --  2.24  CREATININE 1.06  --  1.08  --   --   TROPONINI <0.03 <0.03 <0.03 <0.03  --     Estimated Creatinine Clearance: 89.2 mL/min (by C-G formula based on SCr of 1.08 mg/dL).   Medical History: Past Medical History:  Diagnosis Date  . Atrial fibrillation (HCC)   . Colon polyps   . Diabetes (HCC)   . Hyperlipemia   . Hypertension     Assessment: Pharmacy consulted to dose and monitor warfarin in this 65 year old male admitted with atrial fibrillation. Patient was taking warfarin prior to admission for afib.   Patient's home dose is: warfarin 3 mg PO daily except 4.5 mg on Thursday.  INR = 2.24 is therapeutic  Goal of Therapy:  INR 2-3 Monitor platelets by anticoagulation protocol: Yes   Plan:  Continue with home dose and give warfarin 3 mg PO this evening.  Cindi CarbonMary M Liam Cammarata, Pharm.D., BCPS Clinical Pharmacist 03/02/2016,11:21 AM

## 2016-03-02 NOTE — Progress Notes (Signed)
Iv removed x2 and telemetry. Personal o2 tank delivered to room. No c/o pain no distress. Patient to be discharged home on 02 at 2l

## 2016-03-05 ENCOUNTER — Telehealth: Payer: Self-pay | Admitting: *Deleted

## 2016-03-05 ENCOUNTER — Encounter: Payer: Self-pay | Admitting: *Deleted

## 2016-03-05 NOTE — Telephone Encounter (Signed)
[facility]. Dan HumphreysJoseph Attila Loomis is a 65 y.o. male who presents from the cardiac rehabilitation area for a fast irregular heartbeat evaluation. Patient has had a history of atrial fibrillation intermittently. He states he is currently on Coumadin along with Plavix and has a stent. He is asymptomatic and states that he is usually not aware of when his heart goes in and out of atrial fibrillation. Pain, shortness of breath, near syncope, or any other new concerns.  _____________________ I just called to check on Kenneth Hahn and he said he did get admitted to the hospital after [facility] with heart rate 130-150. He is still on oxgyen which was started in the hospital so I instructed him on pursed lip breathing over the phone. Kenneth Hahn said he will not be in Cardiac Rehab this afternoon but hopes to be back on Wed.

## 2016-03-07 ENCOUNTER — Telehealth: Payer: Self-pay

## 2016-03-07 DIAGNOSIS — Z955 Presence of coronary angioplasty implant and graft: Secondary | ICD-10-CM

## 2016-03-07 NOTE — Progress Notes (Signed)
Daily Session Note  Patient Details  Name: Kenneth Hahn MRN: [id number] Date of Birth: 04-15-1950 Referring Provider:   Flowsheet Row Cardiac Rehab from 01/16/2016 in [facility] Cardiac [facility] Pul[facility]  Referring Provider  Serafina Royals MD      Encounter Date: 03/07/2016  Check In:     Session Check In - 03/07/16 1633      Check-In   Staff Present Jeanell Sparrow, DPT, Burlene Arnt, BA, ACSM CEP, Exercise Physiologist;Other   Supervising physician immediately available to respond to emergencies See telemetry face sheet for immediately available ER MD   Medication changes reported     No   Fall or balance concerns reported    No         Goals Met:    Goals Unmet:    Comments: Pt did not participate in exercise portion of class today--have not yet received clearance to exercise   Dr. Emily Filbert is Medical Director for [place] and [facility].

## 2016-03-07 NOTE — Telephone Encounter (Signed)
Kenneth Hahn came to Cardiac Rehab today and there was no documentation in his file to clear him for exercise.  I spoke with Kenneth Hahn, the RN at Kenneth Philemon KingdomKowalski's office. Kenneth KaufmannJoesph will need to see Kenneth Hahn before he is cleared to return to exercise.  He stated he will call the office.

## 2016-03-27 ENCOUNTER — Encounter: Payer: Self-pay | Admitting: *Deleted

## 2016-03-27 DIAGNOSIS — Z955 Presence of coronary angioplasty implant and graft: Secondary | ICD-10-CM

## 2016-03-27 NOTE — Progress Notes (Signed)
Cardiac Individual Treatment Plan  Patient Details  Name: Kenneth Hahn MRN: [id number] Date of Birth: May 20, 1950 Referring Provider:   Flowsheet Row Cardiac Rehab from 01/16/2016 in [facility] Cardiac and Pulmonary Rehab  Referring Provider  Serafina Royals MD      Initial Encounter Date:  Flowsheet Row Cardiac Rehab from 01/16/2016 in [facility] Cardiac and Pulmonary Rehab  Date  01/16/16  Referring Provider  Serafina Royals MD      Visit Diagnosis: Status post coronary artery stent placement  Patient's Home Medications on Admission:  Current Outpatient Prescriptions:  .  acetaminophen (TYLENOL) 500 MG tablet, Take 1,000 mg by mouth daily., Disp: , Rfl:  .  atenolol (TENORMIN) 25 MG tablet, Take 25 mg by mouth 2 (two) times daily. Take one tablet morning and evening, Disp: , Rfl:  .  atorvastatin (LIPITOR) 80 MG tablet, Take 1 tablet (80 mg total) by mouth daily at 6 PM., Disp: 90 tablet, Rfl: 4 .  benazepril (LOTENSIN) 20 MG tablet, Take 20 mg by mouth daily., Disp: , Rfl:  .  Cholecalciferol (VITAMIN D-3) 1000 units CAPS, Take 1 capsule by mouth daily. , Disp: , Rfl:  .  clopidogrel (PLAVIX) 75 MG tablet, Take 1 tablet (75 mg total) by mouth daily with breakfast., Disp: 90 tablet, Rfl: 4 .  diltiazem (CARDIZEM CD) 240 MG 24 hr capsule, Take 1 capsule (240 mg total) by mouth daily., Disp: 30 capsule, Rfl: 2 .  furosemide (LASIX) 20 MG tablet, Take 20 mg by mouth daily., Disp: , Rfl:  .  insulin glargine (LANTUS) 100 UNIT/ML injection, Inject 0.62 mLs (62 Units total) into the skin at bedtime. (Patient taking differently: Inject 52 Units into the skin at bedtime. ), Disp: 10 mL, Rfl: 11 .  metFORMIN (GLUCOPHAGE-XR) 500 MG 24 hr tablet, Take 500 mg by mouth every evening., Disp: , Rfl:  .  Multiple Vitamin (MULTIVITAMIN) tablet, Take 1 tablet by mouth daily., Disp: , Rfl:  .  Multiple Vitamins-Minerals (PRESERVISION AREDS 2+MULTI VIT) CAPS, Take 1 capsule by mouth 2 (two) times daily.,  Disp: , Rfl:  .  ondansetron (ZOFRAN) 4 MG/2ML SOLN injection, Inject 2 mLs (4 mg total) into the vein every 6 (six) hours as needed for nausea. (Patient not taking: Reported on 01/16/2016), Disp: 2 mL, Rfl: 0 .  tamsulosin (FLOMAX) 0.4 MG CAPS capsule, Take 0.4 mg by mouth at bedtime., Disp: , Rfl:  .  warfarin (COUMADIN) 3 MG tablet, Take 3-4.5 mg by mouth one time only at 6 PM. Take 71m every day, except Thursday, take 4.550m Disp: , Rfl:   Past Medical History: Past Medical History:  Diagnosis Date  . [place]  . [place] polyps   . [place]  . Hyperlipemia   . Hypertension     Tobacco Use: History  Smoking Status  . Former Smoker  . Quit date: 03/26/1973  Smokeless Tobacco  . Never Used    Labs: Recent Review Flowsheet Data    There is no flowsheet data to display.       Exercise Target Goals:    Exercise Program Goal: Individual exercise prescription set with THRR, safety & activity barriers. Participant demonstrates ability to understand and report RPE using BORG scale, to self-measure pulse accurately, and to acknowledge the importance of the exercise prescription.  Exercise Prescription Goal: Starting with aerobic activity 30 plus minutes a day, 3 days per week for initial exercise prescription. Provide home exercise prescription and guidelines that participant acknowledges  understanding prior to discharge.  Activity Barriers & Risk Stratification:     Activity Barriers & Cardiac Risk Stratification - 01/16/16 1223      Activity Barriers & Cardiac Risk Stratification   Activity Barriers Arthritis;Joint Problems;Back Problems;Deconditioning;Muscular Weakness;Left Knee Replacement;Decreased Ventricular Function;Shortness of Breath   Cardiac Risk Stratification High      6 Minute Walk:     6 Minute Walk    Row Name 01/16/16 1405         6 Minute Walk   Phase Initial     Distance 1170 feet     Walk Time 6 minutes     # of Rest  Breaks 0     MPH 2.22     METS 2.68     RPE 13     Perceived Dyspnea  4     VO2 Peak 7.82     Symptoms Yes (comment)     Comments SOB, chronic hip pain (2/10), chronic right ankle pain (2/10)     Resting HR 75 bpm     Resting BP 116/64     Max Ex. HR 109 bpm     Max Ex. BP 146/74     2 Minute Post BP 136/74        Initial Exercise Prescription:     Initial Exercise Prescription - 01/16/16 1400      Date of Initial Exercise RX and Referring Provider   Date 01/16/16   Referring Provider Serafina Royals MD     Treadmill   MPH 2   Grade 0   Minutes 15   METs 2.53     NuStep   Level 1   Minutes 15   METs 2     Biostep-RELP   Level 1   Minutes 15   METs 2     Prescription Details   Frequency (times per week) 3   Duration Progress to 45 minutes of aerobic exercise without signs/symptoms of physical distress     Intensity   THRR 40-80% of Max Heartrate 107-138   Ratings of Perceived Exertion 11-15   Perceived Dyspnea 0-4     Progression   Progression Continue to progress workloads to maintain intensity without signs/symptoms of physical distress.     Resistance Training   Training Prescription Yes   Weight 3 lbs   Reps 10-12      Perform Capillary Blood Glucose checks as needed.  Exercise Prescription Changes:     Exercise Prescription Changes    Row Name 01/16/16 1400 01/25/16 1300 02/09/16 1300 02/23/16 1100       Exercise Review   Progression -  Walk test results  - Yes Yes      Response to Exercise   Blood Pressure (Admit) 116/64 140/68 128/78 118/64    Blood Pressure (Exercise) 146/74 162/88 118/64 130/68    Blood Pressure (Exit) 136/74 132/60 124/78 122/64    Heart Rate (Admit) 75 bpm 88 bpm 56 bpm 123 bpm    Heart Rate (Exercise) 109 bpm 125 bpm 140 bpm 128 bpm    Heart Rate (Exit) 85 bpm 97 bpm  - 108 bpm    Oxygen Saturation (Admit) 97 %  -  -  -    Oxygen Saturation (Exercise) 99 %  -  -  -    Rating of Perceived Exertion (Exercise)  _0 Perceived Dyspnea (Exercise) 4  -  -  -    Symptoms SOB, chronic pain  in hips and right ankle none none  -    Duration  - Progress to 45 minutes of aerobic exercise without signs/symptoms of physical distress Progress to 45 minutes of aerobic exercise without signs/symptoms of physical distress Progress to 45 minutes of aerobic exercise without signs/symptoms of physical distress    Intensity  - THRR unchanged THRR unchanged THRR unchanged      Progression   Progression  - Continue to progress workloads to maintain intensity without signs/symptoms of physical distress. Continue to progress workloads to maintain intensity without signs/symptoms of physical distress. Continue to progress workloads to maintain intensity without signs/symptoms of physical distress.    Average METs  -  -  - 2.85      Resistance Training   Training Prescription  - Yes Yes Yes    Weight  - _0 Reps  - 10-12 10-12 10-12      Interval Training   Interval Training  - No No  -      Treadmill   MPH  - _1 Grade  - 0 1 1    Minutes  - _2 METs  - 2.53 2.81 2.81      NuStep   Level  - _3 Minutes  - _4 METs  - 2.1 1.7 2.9       Exercise Comments:     Exercise Comments    Row Name 01/16/16 1409 01/25/16 1401 02/09/16 1332 02/23/16 1145 03/07/16 1807   Exercise Comments Kenneth Hahn wants to get into better overall shape and lose weight. Kenneth Hahn did very well in his first week of exercise. Kenneth Hahn has progressed well with exercise.  He has had some knee pain that limits his walking more than other equipment. Kenneth Hahn continues to progress well with exercise. Kenneth Hahn attended education portion of class today only--he has not received clearance for exercise yet.   [place] Name 03/09/16 1253           Exercise Comments Kenneth Hahn has not been cleared to return to exercise yet by Kenneth Hahn.  He expects to be back in the next 2 weeks.          Discharge Exercise Prescription (Final  Exercise Prescription Changes):     Exercise Prescription Changes - 02/23/16 1100      Exercise Review   Progression Yes     Response to Exercise   Blood Pressure (Admit) 118/64   Blood Pressure (Exercise) 130/68   Blood Pressure (Exit) 122/64   Heart Rate (Admit) 123 bpm   Heart Rate (Exercise) 128 bpm   Heart Rate (Exit) 108 bpm   Rating of Perceived Exertion (Exercise) 13   Duration Progress to 45 minutes of aerobic exercise without signs/symptoms of physical distress   Intensity THRR unchanged     Progression   Progression Continue to progress workloads to maintain intensity without signs/symptoms of physical distress.   Average METs 2.85     Resistance Training   Training Prescription Yes   Weight 4   Reps 10-12     Treadmill   MPH 2   Grade 1   Minutes 15   METs 2.81     NuStep   Level 3   Minutes 15   METs 2.9      Nutrition:  Target Goals: Understanding of nutrition guidelines, daily intake of sodium <1594m, cholesterol <2016m calories  30% from fat and 7% or less from saturated fats, daily to have 5 or more servings of fruits and vegetables.  Biometrics:     Pre Biometrics - 01/16/16 1412      Pre Biometrics   Height 5' 9.9" (1.775 m)   Weight 289 lb 3.2 oz (131.2 kg)   Waist Circumference 49 inches   Hip Circumference 48.75 inches   Waist to Hip Ratio 1.01 %   BMI (Calculated) 41.7   Single Leg Stand 1.17 seconds       Nutrition Therapy Plan and Nutrition Goals:     Nutrition Therapy & Goals - 01/20/16 1451      Nutrition Therapy   Diet 1700kcal diabetes diet with DASH diet guidelines   Drug/Food Interactions Statins/Certain Fruits;Coumadin/Vit K   Protein (specify units) 8oz   Fiber 30 grams   Whole Grain Foods 3 servings   Saturated Fats 14 max. grams   Fruits and Vegetables 5 servings/day   Sodium 1500 grams     Personal Nutrition Goals   Personal Goal #1 Work on portion control -- especially starches, meats -- eat generous  portions of vegetables.   Personal Goal #2 Try eating slower by drinking water during meals, taking small bites, and/or chewing thoroughly.   Personal Goal #3 Continue with regular exercise     Intervention Plan   Intervention Prescribe, educate and counsel regarding individualized specific dietary modifications aiming towards targeted core components such as weight, hypertension, lipid management, diabetes, heart failure and other comorbidities.;Nutrition handout(s) given to patient.   Expected Outcomes Short Term Goal: Understand basic principles of dietary content, such as calories, fat, sodium, cholesterol and nutrients.;Short Term Goal: A plan has been developed with personal nutrition goals set during dietitian appointment.;Long Term Goal: Adherence to prescribed nutrition plan.      Nutrition Discharge: Rate Your Plate Scores:     Nutrition Assessments - 01/16/16 1225      Rate Your Plate Scores   Pre Score 48   Pre Score % 53 %      Nutrition Goals Re-Evaluation:     Nutrition Goals Re-Evaluation    Row Name 02/22/16 1709             Personal Goal #1 Re-Evaluation   Personal Goal #1 Work on portion control -- especially starches, meats -- eat generous portions of vegetables.       Goal Progress Seen Yes       Comments Portions are smaller  Goal to continue to work on decreasing carbs and portions         Personal Goal #2 Re-Evaluation   Personal Goal #2 Try eating slower by drinking water during meals, taking small bites, and/or chewing thoroughly.       Goal Progress Seen No       Comments States is trying to work on this goal.  Drinking alot of diet soda and water in between.   Goal to continue to work on adding more water to daily meals         Personal Goal #3 Re-Evaluation   Personal Goal #3 Continue with regular exercise       Goal Progress Seen Yes       Comments Exercise  three days with program. Goal continue the exercise program           Psychosocial: Target Goals: Acknowledge presence or absence of depression, maximize coping skills, provide positive support system. Participant is able to verbalize types and ability  to use techniques and skills needed for reducing stress and depression.  Initial Review & Psychosocial Screening:     Initial Psych Review & Screening - 01/16/16 1226      Family Dynamics   Good Support System? Yes     Screening Interventions   Interventions Encouraged to exercise      Quality of Life Scores:     Quality of Life - 01/16/16 1228      Quality of Life Scores   Health/Function Pre 21.93 %   Socioeconomic Pre 24.25 %   Psych/Spiritual Pre 19.86 %   Family Pre 24 %   GLOBAL Pre 22.23 %      PHQ-9: Recent Review Flowsheet Data    Depression screen Kenneth Hahn 2/9 01/16/2016   Decreased Interest 0   Down, Depressed, Hopeless 0   PHQ - 2 Score 0   Altered sleeping 0   Tired, decreased energy 2   Change in appetite 2   Feeling bad or failure about yourself  0   Trouble concentrating 0   Moving slowly or fidgety/restless 1   Suicidal thoughts 0   PHQ-9 Score 5   Difficult doing work/chores Not difficult at all      Psychosocial Evaluation and Intervention:     Psychosocial Evaluation - 01/23/16 1710      Psychosocial Evaluation & Interventions   Interventions Encouraged to exercise with the program and follow exercise prescription   Comments Counselor met with Mr. Chauncey Cruel today for initial psychosocial evaluation.  He will be 66 years old next week and had a stent inserted several weeks ago.  He is married and his wife's adult children live close by.  Mr. Chauncey Cruel has adult children in the [place] area.  His first wife passed in 2008.  Mr. Chauncey Cruel had a knee replacement in 2016 and he currently has diabetes as well as heart problems.  He reports sleeping "too much," although he is up and down urinating frequently in the night - he is in the bed for 12-14 hours typically.  Mr. Chauncey Cruel states he is going  to check with the Kenneth. about this being a possible medication reaction to one of his heart meds.  His appetite has decreased over the past several years; although he continues to contend with being overweight.  He denies a history or current symptoms of depression or anxiety.  He states his mood is typically positive and is even more positive when he isn't feeling so tired.  Mr. Chauncey Cruel reports he has minimal stress other than his health currently.  He has goals to increase his stamina and strength and have more energy while in this class.  Counselor encouraged him to check with his Kenneth. soon about the medications to see if this will improve his energy levels and decrease the amount of time he is in the bed each day.  Staff will continue to follow with Mr. Chauncey Cruel throughout the course of this program.          Psychosocial Re-Evaluation:     Psychosocial Re-Evaluation    [place] Name 02/15/16 1650             Psychosocial Re-Evaluation   Comments Counselor Follow up with Mr. Chauncey Cruel today to see if he was sleeping any better since beginning this class and he stated the "quality" of his sleep has improved.  He also reports he does not like to come to exercise but he notices how much better he feels immediately after the  class and that inspires him to keep coming.  He is still wondering if one of his medications is causing tiredness, but he also thinks it can just be a combination of his heart and his diabetes.  He is working on portion control to help with weight loss and has met with the dietician.  Counselor commended him on his commitment to exercise and improved health.           Vocational Rehabilitation: Provide vocational rehab assistance to qualifying candidates.   Vocational Rehab Evaluation & Intervention:     Vocational Rehab - 01/16/16 1224      Initial Vocational Rehab Evaluation & Intervention   Assessment shows need for Vocational Rehabilitation No      Education: Education Goals: Education  classes will be provided on a Hahn basis, covering required topics. Participant will state understanding/return demonstration of topics presented.  Learning Barriers/Preferences:     Learning Barriers/Preferences - 01/16/16 1223      Learning Barriers/Preferences   Learning Barriers None   Learning Preferences Computer/Internet;Pictoral;Video      Education Topics: General Nutrition Guidelines/Fats and Fiber: -Group instruction provided by verbal, written material, models and posters to present the general guidelines for heart healthy nutrition. Gives an explanation and review of dietary fats and fiber. Flowsheet Row Cardiac Rehab from 03/07/2016 in [facility] Cardiac and Pulmonary Rehab  Date  02/06/16  Educator  SB  Instruction Review Code  2- meets goals/outcomes      Controlling Sodium/Reading Food Labels: -Group verbal and written material supporting the discussion of sodium use in heart healthy nutrition. Review and explanation with models, verbal and written materials for utilization of the food label. Flowsheet Row Cardiac Rehab from 03/07/2016 in [facility] Cardiac and Pulmonary Rehab  Date  02/13/16  Educator  PI  Instruction Review Code  2- meets goals/outcomes      Exercise Physiology & Risk Factors: - Group verbal and written instruction with models to review the exercise physiology of the cardiovascular system and associated critical values. Details cardiovascular disease risk factors and the goals associated with each risk factor. Flowsheet Row Cardiac Rehab from 03/07/2016 in [facility] Cardiac and Pulmonary Rehab  Date  02/01/16  Educator  AS  Instruction Review Code  2- meets goals/outcomes      Aerobic Exercise & Resistance Training: - Gives group verbal and written discussion on the health impact of inactivity. On the components of aerobic and resistive training programs and the benefits of this training and how to safely progress through these programs. Flowsheet Row  Cardiac Rehab from 03/07/2016 in [facility] Cardiac and Pulmonary Rehab  Date  02/20/16  Educator  K. Amedeo Plenty  Instruction Review Code  2- meets goals/outcomes      Flexibility, Balance, General Exercise Guidelines: - Provides group verbal and written instruction on the benefits of flexibility and balance training programs. Provides general exercise guidelines with specific guidelines to those with heart or lung disease. Demonstration and skill practice provided. Flowsheet Row Cardiac Rehab from 03/07/2016 in [facility] Cardiac and Pulmonary Rehab  Date  02/22/16  Educator  AS  Instruction Review Code  2- meets goals/outcomes      Stress Management: - Provides group verbal and written instruction about the health risks of elevated stress, cause of high stress, and healthy ways to reduce stress. Flowsheet Row Cardiac Rehab from 03/07/2016 in [facility] Cardiac and Pulmonary Rehab  Date  03/07/16  Educator  [facility]  Instruction Review Code  2- meets goals/outcomes  Depression: - Provides group verbal and written instruction on the correlation between heart/lung disease and depressed mood, treatment options, and the stigmas associated with seeking treatment. Flowsheet Row Cardiac Rehab from 03/07/2016 in [facility] Cardiac and Pulmonary Rehab  Date  02/08/16  Educator  Berle Mull, MSW2  Instruction Review Code  2- meets goals/outcomes      Anatomy & Physiology of the Heart: - Group verbal and written instruction and models provide basic cardiac anatomy and physiology, with the coronary electrical and arterial systems. Review of: AMI, Angina, Valve disease, Heart Failure, Cardiac Arrhythmia, Pacemakers, and the ICD. Flowsheet Row Cardiac Rehab from 03/07/2016 in [facility] Cardiac and Pulmonary Rehab  Date  02/27/16  Educator  SB  Instruction Review Code  2- meets goals/outcomes      Cardiac Procedures: - Group verbal and written instruction and models to describe the testing methods done to diagnose heart  disease. Reviews the outcomes of the test results. Describes the treatment choices: Medical Management, Angioplasty, or Coronary Bypass Surgery.   Cardiac Medications: - Group verbal and written instruction to review commonly prescribed medications for heart disease. Reviews the medication, class of the drug, and side effects. Includes the steps to properly store meds and maintain the prescription regimen. Flowsheet Row Cardiac Rehab from 03/07/2016 in [facility] Cardiac and Pulmonary Rehab  Date  01/23/16 [part1]  Educator  SB  Instruction Review Code  2- meets goals/outcomes      Go Sex-Intimacy & Heart Disease, Get SMART - Goal Setting: - Group verbal and written instruction through game format to discuss heart disease and the return to sexual intimacy. Provides group verbal and written material to discuss and apply goal setting through the application of the S.M.A.R.T. Method.   Other Matters of the Heart: - Provides group verbal, written materials and models to describe Heart Failure, Angina, Valve Disease, and Diabetes in the realm of heart disease. Includes description of the disease process and treatment options available to the cardiac patient. Flowsheet Row Cardiac Rehab from 03/07/2016 in [facility] Cardiac and Pulmonary Rehab  Date  02/27/16  Educator  SB  Instruction Review Code  2- meets goals/outcomes      Exercise & Equipment Safety: - Individual verbal instruction and demonstration of equipment use and safety with use of the equipment. Flowsheet Row Cardiac Rehab from 03/07/2016 in [facility] Cardiac and Pulmonary Rehab  Date  01/16/16  Educator  C. EnterkinRN  Instruction Review Code  2- meets goals/outcomes      Infection Prevention: - Provides verbal and written material to individual with discussion of infection control including proper hand washing and proper equipment cleaning during exercise session. Flowsheet Row Cardiac Rehab from 03/07/2016 in [facility] Cardiac and Pulmonary  Rehab  Date  01/16/16  Educator  C. EnterkinRN  Instruction Review Code  2- meets goals/outcomes      Falls Prevention: - Provides verbal and written material to individual with discussion of falls prevention and safety. Flowsheet Row Cardiac Rehab from 03/07/2016 in [facility] Cardiac and Pulmonary Rehab  Date  01/16/16  Educator  C. [place]  Instruction Review Code  2- meets goals/outcomes      Diabetes: - Individual verbal and written instruction to review signs/symptoms of diabetes, desired ranges of glucose level fasting, after meals and with exercise. Advice that pre and post exercise glucose checks will be done for 3 sessions at entry of program. [place] from 03/07/2016 in [facility] Cardiac and Pulmonary Rehab  Date  01/16/16  Educator  C.  [place]  Instruction Review Code  2- meets goals/outcomes       Knowledge Questionnaire Score:     Knowledge Questionnaire Score - 01/16/16 1223      Knowledge Questionnaire Score   Pre Score 23/28      Core Components/Risk Factors/Patient Goals at Admission:     Personal Goals and Risk Factors at Admission - 01/16/16 1225      Core Components/Risk Factors/Patient Goals on Admission    Weight Management Yes;Obesity;Weight Loss   Intervention Weight Management: Develop a combined nutrition and exercise program designed to reach desired caloric intake, while maintaining appropriate intake of nutrient and fiber, sodium and fats, and appropriate energy expenditure required for the weight goal.;Weight Management: Provide education and appropriate resources to help participant work on and attain dietary goals.;Weight Management/Obesity: Establish reasonable short term and long term weight goals.;Obesity: Provide education and appropriate resources to help participant work on and attain dietary goals.   Admit Weight 289 lb 3.2 oz (131.2 kg)   Goal Weight: Short Term 285 lb (129.3 kg)   Goal Weight: Long Term 220 lb (99.8  kg)   Expected Outcomes Short Term: Continue to assess and modify interventions until short term weight is achieved;Long Term: Adherence to nutrition and physical activity/exercise program aimed toward attainment of established weight goal;Weight Loss: Understanding of general recommendations for a balanced deficit meal plan, which promotes 1-2 lb weight loss per week and includes a negative energy balance of [phone] kcal/d;Understanding recommendations for meals to include 15-35% energy as protein, 25-35% energy from fat, 35-60% energy from carbohydrates, less than 234m of dietary cholesterol, 20-35 gm of total fiber daily;Understanding of distribution of calorie intake throughout the day with the consumption of 4-5 meals/snacks   Sedentary Yes   Intervention Provide advice, education, support and counseling about physical activity/exercise needs.;Develop an individualized exercise prescription for aerobic and resistive training based on initial evaluation findings, risk stratification, comorbidities and participant's personal goals.   Expected Outcomes Achievement of increased cardiorespiratory fitness and enhanced flexibility, muscular endurance and strength shown through measurements of functional capacity and personal statement of participant.   Increase Strength and Stamina Yes   Intervention Provide advice, education, support and counseling about physical activity/exercise needs.;Develop an individualized exercise prescription for aerobic and resistive training based on initial evaluation findings, risk stratification, comorbidities and participant's personal goals.   Expected Outcomes Achievement of increased cardiorespiratory fitness and enhanced flexibility, muscular endurance and strength shown through measurements of functional capacity and personal statement of participant.   Diabetes Yes   Intervention Provide education about signs/symptoms and action to take for hypo/hyperglycemia.;Provide  education about proper nutrition, including hydration, and aerobic/resistive exercise prescription along with prescribed medications to achieve blood glucose in normal ranges: Fasting glucose 65-99 mg/dL   Expected Outcomes Short Term: Participant verbalizes understanding of the signs/symptoms and immediate care of hyper/hypoglycemia, proper foot care and importance of medication, aerobic/resistive exercise and nutrition plan for blood glucose control.;Long Term: Attainment of HbA1C < 7%.   Heart Failure Yes   Intervention Provide a combined exercise and nutrition program that is supplemented with education, support and counseling about heart failure. Directed toward relieving symptoms such as shortness of breath, decreased exercise tolerance, and extremity edema.   Expected Outcomes Improve functional capacity of life;Short term: Attendance in program 2-3 days a week with increased exercise capacity. Reported lower sodium intake. Reported increased fruit and vegetable intake. Reports medication compliance.;Short term: Daily weights obtained and reported for increase. Utilizing diuretic protocols set by physician.;Long  term: Adoption of self-care skills and reduction of barriers for early signs and symptoms recognition and intervention leading to self-care maintenance.   Hypertension Yes   Intervention Provide education on lifestyle modifcations including regular physical activity/exercise, weight management, moderate sodium restriction and increased consumption of fresh fruit, vegetables, and low fat dairy, alcohol moderation, and smoking cessation.;Monitor prescription use compliance.   Expected Outcomes Short Term: Continued assessment and intervention until BP is < 140/71m HG in hypertensive participants. < 130/854mHG in hypertensive participants with diabetes, heart failure or chronic kidney disease.;Long Term: Maintenance of blood pressure at goal levels.   Lipids Yes   Intervention Provide  education and support for participant on nutrition & aerobic/resistive exercise along with prescribed medications to achieve LDL <7026mHDL >55m81m Expected Outcomes Short Term: Participant states understanding of desired cholesterol values and is compliant with medications prescribed. Participant is following exercise prescription and nutrition guidelines.;Long Term: Cholesterol controlled with medications as prescribed, with individualized exercise RX and with personalized nutrition plan. Value goals: LDL < 70mg37mL > 40 mg.      Core Components/Risk Factors/Patient Goals Review:      Goals and Risk Factor Review    Row Name 01/27/16 1416 02/22/16 1715 02/22/16 1719         Core Components/Risk Factors/Patient Goals Review   Personal Goals Review Weight Management/Obesity;Increase Strength and Stamina;Lipids;Hypertension;Diabetes Weight Management/Obesity;Hypertension;Lipids;Heart Failure;Diabetes  -     Review Joes weight is currently 286.  he has met with the dietician and confirmed his knowledge of ways to manage his DM.  He is also watching his food intake.    He is exercising 3 days per week and reports feeling better after exercise.  HTN and cholesterol are the same and he is taking meds as directed.   - JOeDurenda [facility] his weight is running up and down, though hehas maintained 3 pounds down. He is working on his nutrition goals and is attending class for the exercise.  He stated that his energy levels do flucuate, and he thinks he has more stamina than he did when he started,. He feels the exercise has been helful. He continues to maintain BP, chol diabetes in good range with the medications, nutrition and exercise.     Expected Outcomes Kenneth Hahn will continue to praactice healthier habits with diet and exercise and see an overall improvement in his health.   - Kenneth [facility]ahn will continue to practice healthier habits with diet and exercise and see an overall improvement in his health.         Core  Components/Risk Factors/Patient Goals at Discharge (Final Review):      Goals and Risk Factor Review - 02/22/16 1719      Core Components/Risk Factors/Patient Goals Review   Review JOespDurenda Hahn his weight is running up and down, though hehas maintained 3 pounds down. He is working on his nutrition goals and is attending class for the exercise.  He stated that his energy levels do flucuate, and he thinks he has more stamina than he did when he started,. He feels the exercise has been helful. He continues to maintain BP, chol diabetes in good range with the medications, nutrition and exercise.   Expected Outcomes Kenneth [facility]ahn will continue to practice healthier habits with diet and exercise and see an overall improvement in his health.       ITP Comments:     ITP Comments    Row Name 01/16/16 1224 01/16/16 1258 02/01/16 0646 [id number] 02/29/16 1731  ITP Comments Kenneth Hahn said he knows what to eat but he loves carbs like bread. He doesn't really like vegetables. ITP created during Cardiac Medical Review appt after the Cardiac Rehab informed consent was signed.  30 day review completed for Medical Director physician review and signature. Continue ITP unless changes made by physician. 30 day review completed for review by Kenneth Emily [facility].  Continue with ITP unless changes noted by Kenneth Sabra Heck. Heart rate 150 upon arrival to Cardiac REhab. Walked slow and heart rate never below 130 blood pressure was stable at 142/80. Taken to [employer] via wheelchair since after 3 glasses of 240 cc of water his heart rate was still 130. Kenneth Hahn admits he was up alot last night since his blood sugar was up.    [place] Name 03/05/16 1130 03/09/16 1254 03/23/16 1449 03/27/16 0650     ITP Comments I just called to check on Kenneth Hahn and he said he did get admitted to the [facility] after I took him to the [place] with heart rate 130-150. He is still on oxgyen which was started in the [facility] so I instructed him on pursed lip  breathing over the phone. Kenneth Hahn said he will not be in Cardiac Rehab this afternoon but hopes to be back on Wed. Kenneth Hahn has not been cleared to return to exercise yet by Kenneth [facility]ahn.  He expects to be back in the next 2 weeks. Kenneth Hahn had to wear a 24/48hr holter.  He was found to be in afib.  He has another follow up on Jan 9.  Hopefully will be able to return after that. 30 day review. Continue with ITP unless directed changes per Medical Director review.       Comments:

## 2016-03-28 ENCOUNTER — Encounter: Payer: Medicare Other | Attending: Cardiology

## 2016-03-28 DIAGNOSIS — Z955 Presence of coronary angioplasty implant and graft: Secondary | ICD-10-CM | POA: Insufficient documentation

## 2016-04-02 ENCOUNTER — Encounter: Payer: Medicare Other | Admitting: *Deleted

## 2016-04-02 DIAGNOSIS — Z955 Presence of coronary angioplasty implant and graft: Secondary | ICD-10-CM | POA: Diagnosis present

## 2016-04-02 NOTE — Progress Notes (Signed)
Daily Session Note  Patient Details  Name: Rayshon Albaugh MRN: [id number] Date of Birth: Jun 14, 1950 Referring Provider:   Flowsheet Row Cardiac Rehab from 01/16/2016 in [facility] Cardiac and Pulmonary Rehab  Referring Provider  Serafina Royals MD      Encounter Date: 04/02/2016  Check In:     Session Check In - 04/02/16 1627      Check-In   Location [facility]-Cardiac & Pulmonary Rehab   Staff Present Gerlene Burdock, RN, BSN;Susanne Bice, RN, BSN, Laveda Norman, BS, ACSM CEP, Exercise Physiologist   Supervising physician immediately available to respond to emergencies See telemetry face sheet for immediately available ER MD   Medication changes reported     No   Fall or balance concerns reported    No   Warm-up and Cool-down Performed on first and last piece of equipment   Resistance Training Performed Yes   VAD Patient? No     Pain Assessment   Currently in Pain? No/denies   Multiple Pain Sites No         Goals Met:  Proper associated with RPD/PD & O2 Sat Exercise tolerated well  Goals Unmet:  Not Applicable  Comments:     Dr. Emily Filbert is Medical Director for [place] and [facility].

## 2016-04-02 NOTE — Progress Notes (Signed)
Daily Session Note  Patient Details  Name: Kenneth [facility] MRN: [id number] Date of Birth: Sep 05, 1950 Referring Provider:   Flowsheet Row Cardiac Rehab from 01/16/2016 in [facility] Cardiac and Pulmonary Rehab  Referring Provider  Serafina Royals MD      Encounter Date: 04/02/2016  Check In:     Session Check In - 04/02/16 1627      Check-In   Location [facility]-Cardiac & Pulmonary Rehab   Staff Present Gerlene Burdock, RN, BSN;Susanne Bice, RN, BSN, Laveda Norman, BS, ACSM CEP, Exercise Physiologist   Supervising physician immediately available to respond to emergencies See telemetry face sheet for immediately available ER MD   Medication changes reported     No   Fall or balance concerns reported    No   Warm-up and Cool-down Performed on first and last piece of equipment   Resistance Training Performed Yes   VAD Patient? No     Pain Assessment   Currently in Pain? No/denies   Multiple Pain Sites No         Goals Met:  Independence with exercise equipment Exercise tolerated well No report of cardiac concerns or symptoms Strength training completed today  Goals Unmet:  Not Applicable  Comments: Pt able to follow exercise prescription today without complaint.  Will continue to monitor for progression.    Dr. Emily Filbert is Medical Director for [place] and [facility].

## 2016-04-04 ENCOUNTER — Encounter: Payer: Medicare Other | Admitting: *Deleted

## 2016-04-04 DIAGNOSIS — Z955 Presence of coronary angioplasty implant and graft: Secondary | ICD-10-CM

## 2016-04-04 NOTE — Patient Instructions (Addendum)
Discharge Instructions  Patient Details  Name: Kenneth Hahn MRN: [id number] Date of Birth: 1950/12/15 Referring Provider:  Marcina Millard, MD   Number of Visits:   Reason for Discharge:  Patient reached a stable level of exercise. Patient independent in their exercise..;jyhy  Smoking History:  History  Smoking Status  . Former Smoker  . Quit date: 03/26/1973  Smokeless Tobacco  . Never Used    Diagnosis:  Status post coronary artery stent placement  Initial Exercise Prescription:     Initial Exercise Prescription - 01/16/16 1400      Date of Initial Exercise RX and Referring Provider   Date 01/16/16   Referring Provider Arnoldo Hooker MD     Treadmill   MPH 2   Grade 0   Minutes 15   METs 2.53     NuStep   Level 1   Minutes 15   METs 2     Biostep-RELP   Level 1   Minutes 15   METs 2     Prescription Details   Frequency (times per week) 3   Duration Progress to 45 minutes of aerobic exercise without signs/symptoms of physical distress     Intensity   THRR 40-80% of Max Heartrate 107-138   Ratings of Perceived Exertion 11-15   Perceived Dyspnea 0-4     Progression   Progression Continue to progress workloads to maintain intensity without signs/symptoms of physical distress.     Resistance Training   Training Prescription Yes   Weight 3 lbs   Reps 10-12      Discharge Exercise Prescription (Final Exercise Prescription Changes):     Exercise Prescription Changes - 02/23/16 1100      Exercise Review   Progression Yes     Response to Exercise   Blood Pressure (Admit) 118/64   Blood Pressure (Exercise) 130/68   Blood Pressure (Exit) 122/64   Heart Rate (Admit) 123 bpm   Heart Rate (Exercise) 128 bpm   Heart Rate (Exit) 108 bpm   Rating of Perceived Exertion (Exercise) 13   Duration Progress to 45 minutes of aerobic exercise without signs/symptoms of physical distress   Intensity THRR unchanged     Progression    Progression Continue to progress workloads to maintain intensity without signs/symptoms of physical distress.   Average METs 2.85     Resistance Training   Training Prescription Yes   Weight 4   Reps 10-12     Treadmill   MPH 2   Grade 1   Minutes 15   METs 2.81     NuStep   Level 3   Minutes 15   METs 2.9      Functional Capacity:     6 Minute Walk    Row Name 01/16/16 1405         6 Minute Walk   Phase Initial     Distance 1170 feet     Walk Time 6 minutes     # of Rest Breaks 0     MPH 2.22     METS 2.68     RPE 13     Perceived Dyspnea  4     VO2 Peak 7.82     Symptoms Yes (comment)     Comments SOB, chronic hip pain (2/10), chronic right ankle pain (2/10)     Resting HR 75 bpm     Resting BP 116/64     Max Ex. HR 109 bpm     Max  Ex. BP 146/74     2 Minute Post BP 136/74        Quality of Life:     Quality of Life - 04/04/16 1834      Quality of Life Scores   Health/Function Post 28 %   Socioeconomic Post 30 %   Psych/Spiritual Post 30 %   Family Post 30 %   GLOBAL Post 29.14 %      Personal Goals: Goals established at orientation with interventions provided to work toward goal.     Personal Goals and Risk Factors at Admission - 01/16/16 1225      Core Components/Risk Factors/Patient Goals on Admission    Weight Management Yes;Obesity;Weight Loss   Intervention Weight Management: Develop a combined nutrition and exercise program designed to reach desired caloric intake, while maintaining appropriate intake of nutrient and fiber, sodium and fats, and appropriate energy expenditure required for the weight goal.;Weight Management: Provide education and appropriate resources to help participant work on and attain dietary goals.;Weight Management/Obesity: Establish reasonable short term and long term weight goals.;Obesity: Provide education and appropriate resources to help participant work on and attain dietary goals.   Admit Weight 289 lb 3.2  oz (131.2 kg)   Goal Weight: Short Term 285 lb (129.3 kg)   Goal Weight: Long Term 220 lb (99.8 kg)   Expected Outcomes Short Term: Continue to assess and modify interventions until short term weight is achieved;Long Term: Adherence to nutrition and physical activity/exercise program aimed toward attainment of established weight goal;Weight Loss: Understanding of general recommendations for a balanced deficit meal plan, which promotes 1-2 lb weight loss per week and includes a negative energy balance of [phone] kcal/d;Understanding recommendations for meals to include 15-35% energy as protein, 25-35% energy from fat, 35-60% energy from carbohydrates, less than 200mg  of dietary cholesterol, 20-35 gm of total fiber daily;Understanding of distribution of calorie intake throughout the day with the consumption of 4-5 meals/snacks   Sedentary Yes   Intervention Provide advice, education, support and counseling about physical activity/exercise needs.;Develop an individualized exercise prescription for aerobic and resistive training based on initial evaluation findings, risk stratification, comorbidities and participant's personal goals.   Expected Outcomes Achievement of increased cardiorespiratory fitness and enhanced flexibility, muscular endurance and strength shown through measurements of functional capacity and personal statement of participant.   Increase Strength and Stamina Yes   Intervention Provide advice, education, support and counseling about physical activity/exercise needs.;Develop an individualized exercise prescription for aerobic and resistive training based on initial evaluation findings, risk stratification, comorbidities and participant's personal goals.   Expected Outcomes Achievement of increased cardiorespiratory fitness and enhanced flexibility, muscular endurance and strength shown through measurements of functional capacity and personal statement of participant.   Diabetes Yes    Intervention Provide education about signs/symptoms and action to take for hypo/hyperglycemia.;Provide education about proper nutrition, including hydration, and aerobic/resistive exercise prescription along with prescribed medications to achieve blood glucose in normal ranges: Fasting glucose 65-99 mg/dL   Expected Outcomes Short Term: Participant verbalizes understanding of the signs/symptoms and immediate care of hyper/hypoglycemia, proper foot care and importance of medication, aerobic/resistive exercise and nutrition plan for blood glucose control.;Long Term: Attainment of HbA1C < 7%.   Heart Failure Yes   Intervention Provide a combined exercise and nutrition program that is supplemented with education, support and counseling about heart failure. Directed toward relieving symptoms such as shortness of breath, decreased exercise tolerance, and extremity edema.   Expected Outcomes Improve functional capacity of life;Short term: Attendance  in program 2-3 days a week with increased exercise capacity. Reported lower sodium intake. Reported increased fruit and vegetable intake. Reports medication compliance.;Short term: Daily weights obtained and reported for increase. Utilizing diuretic protocols set by physician.;Long term: Adoption of self-care skills and reduction of barriers for early signs and symptoms recognition and intervention leading to self-care maintenance.   Hypertension Yes   Intervention Provide education on lifestyle modifcations including regular physical activity/exercise, weight management, moderate sodium restriction and increased consumption of fresh fruit, vegetables, and low fat dairy, alcohol moderation, and smoking cessation.;Monitor prescription use compliance.   Expected Outcomes Short Term: Continued assessment and intervention until BP is < 140/32mm HG in hypertensive participants. < 130/48mm HG in hypertensive participants with diabetes, heart failure or chronic kidney  disease.;Long Term: Maintenance of blood pressure at goal levels.   Lipids Yes   Intervention Provide education and support for participant on nutrition & aerobic/resistive exercise along with prescribed medications to achieve LDL 70mg , HDL >40mg .   Expected Outcomes Short Term: Participant states understanding of desired cholesterol values and is compliant with medications prescribed. Participant is following exercise prescription and nutrition guidelines.;Long Term: Cholesterol controlled with medications as prescribed, with individualized exercise RX and with personalized nutrition plan. Value goals: LDL < 70mg , HDL > 40 mg.       Personal Goals Discharge:     Goals and Risk Factor Review - 04/02/16 1709      Core Components/Risk Factors/Patient Goals Review   Personal Goals Review Weight Management/Obesity   Review Was in the hospital so a little harder and HBA1c is up but he is working on this. Densel will see Cardiologist in the am again to evaluate his holter monitor.    Expected Outcomes Heart healthy life      Nutrition & Weight - Outcomes:     Pre Biometrics - 01/16/16 1412      Pre Biometrics   Height 5' 9.9" (1.775 m)   Weight 289 lb 3.2 oz (131.2 kg)   Waist Circumference 49 inches   Hip Circumference 48.75 inches   Waist to Hip Ratio 1.01 %   BMI (Calculated) 41.7   Single Leg Stand 1.17 seconds       Nutrition:     Nutrition Therapy & Goals - 01/20/16 1451      Nutrition Therapy   Diet 1700kcal diabetes diet with DASH diet guidelines   Drug/Food Interactions Statins/Certain Fruits;Coumadin/Vit K   Protein (specify units) 8oz   Fiber 30 grams   Whole Grain Foods 3 servings   Saturated Fats 14 max. grams   Fruits and Vegetables 5 servings/day   Sodium 1500 grams     Personal Nutrition Goals   Personal Goal #1 Work on portion control -- especially starches, meats -- eat generous portions of vegetables.   Personal Goal #2 Try eating slower by drinking  water during meals, taking small bites, and/or chewing thoroughly.   Personal Goal #3 Continue with regular exercise     Intervention Plan   Intervention Prescribe, educate and counsel regarding individualized specific dietary modifications aiming towards targeted core components such as weight, hypertension, lipid management, diabetes, heart failure and other comorbidities.;Nutrition handout(s) given to patient.   Expected Outcomes Short Term Goal: Understand basic principles of dietary content, such as calories, fat, sodium, cholesterol and nutrients.;Short Term Goal: A plan has been developed with personal nutrition goals set during dietitian appointment.;Long Term Goal: Adherence to prescribed nutrition plan.      Nutrition Discharge:  Nutrition Assessments - 04/04/16 1832      Rate Your Plate Scores   Post Score 61   Post Score % 67.7 %      Education Questionnaire Score:     Knowledge Questionnaire Score - 04/04/16 1832      Knowledge Questionnaire Score   Post Score 24      Goals reviewed with patient; copy given to patient. Discharge Instructions  Patient Details  Name: Kenneth Hahn MRN: [id number] Date of Birth: 08/21/1950 Referring Provider:  Marcina MillardParaschos, Alexander, MD   Number of Visits:    Reason for Discharge:  Patient reached a stable level of exercise. Patient independent in their exercise.  Smoking History:  History  Smoking Status  . Former Smoker  . Quit date: 03/26/1973  Smokeless Tobacco  . Never Used    Diagnosis:  Status post coronary artery stent placement  Initial Exercise Prescription:     Initial Exercise Prescription - 01/16/16 1400      Date of Initial Exercise RX and Referring Provider   Date 01/16/16   Referring Provider Arnoldo HookerKowalski, Bruce MD     Treadmill   MPH 2   Grade 0   Minutes 15   METs 2.53     NuStep   Level 1   Minutes 15   METs 2     Biostep-RELP   Level 1   Minutes 15   METs 2      Prescription Details   Frequency (times per week) 3   Duration Progress to 45 minutes of aerobic exercise without signs/symptoms of physical distress     Intensity   THRR 40-80% of Max Heartrate 107-138   Ratings of Perceived Exertion 11-15   Perceived Dyspnea 0-4     Progression   Progression Continue to progress workloads to maintain intensity without signs/symptoms of physical distress.     Resistance Training   Training Prescription Yes   Weight 3 lbs   Reps 10-12      Discharge Exercise Prescription (Final Exercise Prescription Changes):     Exercise Prescription Changes - 04/05/16 1100      Response to Exercise   Blood Pressure (Admit) 124/80   Blood Pressure (Exercise) 136/64   Blood Pressure (Exit) 128/70   Heart Rate (Admit) 99 bpm   Heart Rate (Exercise) 126 bpm   Heart Rate (Exit) 96 bpm   Rating of Perceived Exertion (Exercise) 12   Symptoms none   Duration Progress to 45 minutes of aerobic exercise without signs/symptoms of physical distress   Intensity THRR unchanged     Progression   Progression Continue to progress workloads to maintain intensity without signs/symptoms of physical distress.   Average METs 2.81     Resistance Training   Training Prescription Yes   Weight 4   Reps 10-12     Treadmill   MPH 2   Grade 1   Minutes 15   METs 2.81      Functional Capacity:     6 Minute Walk    Row Name 01/16/16 1405 04/05/16 1133       6 Minute Walk   Phase Initial Discharge    Distance 1170 feet 1235 feet    Walk Time 6 minutes 6 minutes    # of Rest Breaks 0 0    MPH 2.22 2.34    METS 2.68 2.76    RPE 13 13    Perceived Dyspnea  4  -    VO2 Peak 7.82  8.5    Symptoms Yes (comment) Yes (comment)    Comments SOB, chronic hip pain (2/10), chronic right ankle pain (2/10) knees hurt    Resting HR 75 bpm 99 bpm    Resting BP 116/64 124/80    Max Ex. HR 109 bpm 114 bpm    Max Ex. BP 146/74 136/64    2 Minute Post BP 136/74  -        Quality of Life:     Quality of Life - 04/04/16 1834      Quality of Life Scores   Health/Function Post 28 %   Socioeconomic Post 30 %   Psych/Spiritual Post 30 %   Family Post 30 %   GLOBAL Post 29.14 %      Personal Goals: Goals established at orientation with interventions provided to work toward goal.     Personal Goals and Risk Factors at Admission - 01/16/16 1225      Core Components/Risk Factors/Patient Goals on Admission    Weight Management Yes;Obesity;Weight Loss   Intervention Weight Management: Develop a combined nutrition and exercise program designed to reach desired caloric intake, while maintaining appropriate intake of nutrient and fiber, sodium and fats, and appropriate energy expenditure required for the weight goal.;Weight Management: Provide education and appropriate resources to help participant work on and attain dietary goals.;Weight Management/Obesity: Establish reasonable short term and long term weight goals.;Obesity: Provide education and appropriate resources to help participant work on and attain dietary goals.   Admit Weight 289 lb 3.2 oz (131.2 kg)   Goal Weight: Short Term 285 lb (129.3 kg)   Goal Weight: Long Term 220 lb (99.8 kg)   Expected Outcomes Short Term: Continue to assess and modify interventions until short term weight is achieved;Long Term: Adherence to nutrition and physical activity/exercise program aimed toward attainment of established weight goal;Weight Loss: Understanding of general recommendations for a balanced deficit meal plan, which promotes 1-2 lb weight loss per week and includes a negative energy balance of [phone] kcal/d;Understanding recommendations for meals to include 15-35% energy as protein, 25-35% energy from fat, 35-60% energy from carbohydrates, less than 200mg  of dietary cholesterol, 20-35 gm of total fiber daily;Understanding of distribution of calorie intake throughout the day with the consumption of 4-5  meals/snacks   Sedentary Yes   Intervention Provide advice, education, support and counseling about physical activity/exercise needs.;Develop an individualized exercise prescription for aerobic and resistive training based on initial evaluation findings, risk stratification, comorbidities and participant's personal goals.   Expected Outcomes Achievement of increased cardiorespiratory fitness and enhanced flexibility, muscular endurance and strength shown through measurements of functional capacity and personal statement of participant.   Increase Strength and Stamina Yes   Intervention Provide advice, education, support and counseling about physical activity/exercise needs.;Develop an individualized exercise prescription for aerobic and resistive training based on initial evaluation findings, risk stratification, comorbidities and participant's personal goals.   Expected Outcomes Achievement of increased cardiorespiratory fitness and enhanced flexibility, muscular endurance and strength shown through measurements of functional capacity and personal statement of participant.   Diabetes Yes   Intervention Provide education about signs/symptoms and action to take for hypo/hyperglycemia.;Provide education about proper nutrition, including hydration, and aerobic/resistive exercise prescription along with prescribed medications to achieve blood glucose in normal ranges: Fasting glucose 65-99 mg/dL   Expected Outcomes Short Term: Participant verbalizes understanding of the signs/symptoms and immediate care of hyper/hypoglycemia, proper foot care and importance of medication, aerobic/resistive exercise and nutrition plan for blood glucose control.;Long Term: Attainment  of HbA1C < 7%.   Heart Failure Yes   Intervention Provide a combined exercise and nutrition program that is supplemented with education, support and counseling about heart failure. Directed toward relieving symptoms such as shortness of breath,  decreased exercise tolerance, and extremity edema.   Expected Outcomes Improve functional capacity of life;Short term: Attendance in program 2-3 days a week with increased exercise capacity. Reported lower sodium intake. Reported increased fruit and vegetable intake. Reports medication compliance.;Short term: Daily weights obtained and reported for increase. Utilizing diuretic protocols set by physician.;Long term: Adoption of self-care skills and reduction of barriers for early signs and symptoms recognition and intervention leading to self-care maintenance.   Hypertension Yes   Intervention Provide education on lifestyle modifcations including regular physical activity/exercise, weight management, moderate sodium restriction and increased consumption of fresh fruit, vegetables, and low fat dairy, alcohol moderation, and smoking cessation.;Monitor prescription use compliance.   Expected Outcomes Short Term: Continued assessment and intervention until BP is < 140/25mm HG in hypertensive participants. < 130/39mm HG in hypertensive participants with diabetes, heart failure or chronic kidney disease.;Long Term: Maintenance of blood pressure at goal levels.   Lipids Yes   Intervention Provide education and support for participant on nutrition & aerobic/resistive exercise along with prescribed medications to achieve LDL 70mg , HDL >40mg .   Expected Outcomes Short Term: Participant states understanding of desired cholesterol values and is compliant with medications prescribed. Participant is following exercise prescription and nutrition guidelines.;Long Term: Cholesterol controlled with medications as prescribed, with individualized exercise RX and with personalized nutrition plan. Value goals: LDL < 70mg , HDL > 40 mg.       Personal Goals Discharge:     Goals and Risk Factor Review - 04/02/16 1709      Core Components/Risk Factors/Patient Goals Review   Personal Goals Review Weight Management/Obesity    Review Was in the hospital so a little harder and HBA1c is up but he is working on this. Donley will see Cardiologist in the am again to evaluate his holter monitor.    Expected Outcomes Heart healthy life      Nutrition & Weight - Outcomes:     Pre Biometrics - 01/16/16 1412      Pre Biometrics   Height 5' 9.9" (1.775 m)   Weight 289 lb 3.2 oz (131.2 kg)   Waist Circumference 49 inches   Hip Circumference 48.75 inches   Waist to Hip Ratio 1.01 %   BMI (Calculated) 41.7   Single Leg Stand 1.17 seconds       Nutrition:     Nutrition Therapy & Goals - 01/20/16 1451      Nutrition Therapy   Diet 1700kcal diabetes diet with DASH diet guidelines   Drug/Food Interactions Statins/Certain Fruits;Coumadin/Vit K   Protein (specify units) 8oz   Fiber 30 grams   Whole Grain Foods 3 servings   Saturated Fats 14 max. grams   Fruits and Vegetables 5 servings/day   Sodium 1500 grams     Personal Nutrition Goals   Personal Goal #1 Work on portion control -- especially starches, meats -- eat generous portions of vegetables.   Personal Goal #2 Try eating slower by drinking water during meals, taking small bites, and/or chewing thoroughly.   Personal Goal #3 Continue with regular exercise     Intervention Plan   Intervention Prescribe, educate and counsel regarding individualized specific dietary modifications aiming towards targeted core components such as weight, hypertension, lipid management, diabetes, heart failure and other comorbidities.;Nutrition handout(s)  given to patient.   Expected Outcomes Short Term Goal: Understand basic principles of dietary content, such as calories, fat, sodium, cholesterol and nutrients.;Short Term Goal: A plan has been developed with personal nutrition goals set during dietitian appointment.;Long Term Goal: Adherence to prescribed nutrition plan.      Nutrition Discharge:     Nutrition Assessments - 04/04/16 1832      Rate Your Plate Scores    Post Score 61   Post Score % 67.7 %      Education Questionnaire Score:     Knowledge Questionnaire Score - 04/04/16 1832      Knowledge Questionnaire Score   Post Score 24      Goals reviewed with patient; copy given to patient.

## 2016-04-04 NOTE — Progress Notes (Signed)
Daily Session Note  Patient Details  Name: Kenneth Hahn MRN: [id number] Date of Birth: 06-Jan-1951 Referring Provider:   Flowsheet Row Cardiac Rehab from 01/16/2016 in [facility]  Referring Provider  Serafina Royals MD      Encounter Date: 04/04/2016  Check In:     Session Check In - 04/04/16 1632      Check-In   Location [facility]-Cardiac & Pulmonary Rehab   Staff Present Gerlene Burdock, RN, Vickki Hearing, BA, ACSM CEP, Exercise Physiologist;Patricia Surles RN BSN   Supervising physician immediately available to respond to emergencies See telemetry face sheet for immediately available ER MD   Medication changes reported     No   Fall or balance concerns reported    No   Warm-up and Cool-down Performed on first and last piece of equipment   Resistance Training Performed No   VAD Patient? No     Pain Assessment   Currently in Pain? No/denies         Goals Met:  Proper associated with RPD/PD & O2 Sat Exercise tolerated well No report of cardiac concerns or symptoms  Goals Unmet:  Not Applicable  Comments:     Dr. Emily Filbert is Medical Director for [facility] and [facility].

## 2016-04-05 ENCOUNTER — Encounter: Payer: Medicare Other | Admitting: *Deleted

## 2016-04-05 VITALS — Ht 69.9 in

## 2016-04-05 DIAGNOSIS — Z955 Presence of coronary angioplasty implant and graft: Secondary | ICD-10-CM | POA: Diagnosis not present

## 2016-04-05 NOTE — Progress Notes (Signed)
Daily Session Note  Patient Details  Name: Kenneth Hahn MRN: [id number] Date of Birth: 1950-08-14 Referring Provider:   Flowsheet Row Cardiac Rehab from 01/16/2016 in [facility] Cardiac [facility] Pul[facility] Rehab  Referring Provider  Serafina Royals MD      Encounter Date: 04/05/2016  Check In:     Session Check In - 04/05/16 1632      Check-In   Location [facility]-Cardiac & Pulmonary Rehab   Staff Present Nada Maclachlan, BA, ACSM CEP, Exercise Physiologist;Kelly Amedeo Plenty, BS, ACSM CEP, Exercise Physiologist;Patricia Surles RN BSN   Supervising physician immediately available to respond to emergencies See telemetry face sheet for immediately available ER MD   Medication changes reported     No   Fall or balance concerns reported    No   Warm-up and Cool-down Performed on first and last piece of equipment   Resistance Training Performed Yes   VAD Patient? No     Pain Assessment   Currently in Pain? No/denies           Exercise Prescription Changes - 04/05/16 1100      Response to Exercise   Blood Pressure (Admit) 124/80   Blood Pressure (Exercise) 136/64   Blood Pressure (Exit) 128/70   Heart Rate (Admit) 99 bpm   Heart Rate (Exercise) 126 bpm   Heart Rate (Exit) 96 bpm   Rating of Perceived Exertion (Exercise) 12   Symptoms none   Duration Progress to 45 minutes of aerobic exercise without signs/symptoms of physical distress   Intensity THRR unchanged     Progression   Progression Continue to progress workloads to maintain intensity without signs/symptoms of physical distress.   Average METs 2.81     Resistance Training   Training Prescription Yes   Weight 4   Reps 10-12     Treadmill   MPH 2   Grade 1   Minutes 15   METs 2.81      Goals Met:  Proper associated with RPD/PD & O2 Sat Exercise tolerated well  Goals Unmet:  Not Applicable  Comments:     Dr. Emily Filbert is Medical Director for [place] and [facility].

## 2016-04-05 NOTE — Progress Notes (Signed)
Cardiac Individual Treatment Plan  Patient Details  Name: Kenneth Hahn MRN: [id number] Date of Birth: May 20, 1950 Referring Provider:   Flowsheet Row Cardiac Rehab from 01/16/2016 in [facility] Cardiac and Pulmonary Rehab  Referring Provider  Serafina Royals MD      Initial Encounter Date:  Flowsheet Row Cardiac Rehab from 01/16/2016 in [facility] Cardiac and Pulmonary Rehab  Date  01/16/16  Referring Provider  Serafina Royals MD      Visit Diagnosis: Status post coronary artery stent placement  Patient's Home Medications on Admission:  Current Outpatient Prescriptions:  .  acetaminophen (TYLENOL) 500 MG tablet, Take 1,000 mg by mouth daily., Disp: , Rfl:  .  atenolol (TENORMIN) 25 MG tablet, Take 25 mg by mouth 2 (two) times daily. Take one tablet morning and evening, Disp: , Rfl:  .  atorvastatin (LIPITOR) 80 MG tablet, Take 1 tablet (80 mg total) by mouth daily at 6 PM., Disp: 90 tablet, Rfl: 4 .  benazepril (LOTENSIN) 20 MG tablet, Take 20 mg by mouth daily., Disp: , Rfl:  .  Cholecalciferol (VITAMIN D-3) 1000 units CAPS, Take 1 capsule by mouth daily. , Disp: , Rfl:  .  clopidogrel (PLAVIX) 75 MG tablet, Take 1 tablet (75 mg total) by mouth daily with breakfast., Disp: 90 tablet, Rfl: 4 .  diltiazem (CARDIZEM CD) 240 MG 24 hr capsule, Take 1 capsule (240 mg total) by mouth daily., Disp: 30 capsule, Rfl: 2 .  furosemide (LASIX) 20 MG tablet, Take 20 mg by mouth daily., Disp: , Rfl:  .  insulin glargine (LANTUS) 100 UNIT/ML injection, Inject 0.62 mLs (62 Units total) into the skin at bedtime. (Patient taking differently: Inject 52 Units into the skin at bedtime. ), Disp: 10 mL, Rfl: 11 .  metFORMIN (GLUCOPHAGE-XR) 500 MG 24 hr tablet, Take 500 mg by mouth every evening., Disp: , Rfl:  .  Multiple Vitamin (MULTIVITAMIN) tablet, Take 1 tablet by mouth daily., Disp: , Rfl:  .  Multiple Vitamins-Minerals (PRESERVISION AREDS 2+MULTI VIT) CAPS, Take 1 capsule by mouth 2 (two) times daily.,  Disp: , Rfl:  .  ondansetron (ZOFRAN) 4 MG/2ML SOLN injection, Inject 2 mLs (4 mg total) into the vein every 6 (six) hours as needed for nausea. (Patient not taking: Reported on 01/16/2016), Disp: 2 mL, Rfl: 0 .  tamsulosin (FLOMAX) 0.4 MG CAPS capsule, Take 0.4 mg by mouth at bedtime., Disp: , Rfl:  .  warfarin (COUMADIN) 3 MG tablet, Take 3-4.5 mg by mouth one time only at 6 PM. Take 71m every day, except Thursday, take 4.550m Disp: , Rfl:   Past Medical History: Past Medical History:  Diagnosis Date  . [place]  . [place] polyps   . [place]  . Hyperlipemia   . Hypertension     Tobacco Use: History  Smoking Status  . Former Smoker  . Quit date: 03/26/1973  Smokeless Tobacco  . Never Used    Labs: Recent Review Flowsheet Data    There is no flowsheet data to display.       Exercise Target Goals:    Exercise Program Goal: Individual exercise prescription set with THRR, safety & activity barriers. Participant demonstrates ability to understand and report RPE using BORG scale, to self-measure pulse accurately, and to acknowledge the importance of the exercise prescription.  Exercise Prescription Goal: Starting with aerobic activity 30 plus minutes a day, 3 days per week for initial exercise prescription. Provide home exercise prescription and guidelines that participant acknowledges  understanding prior to discharge.  Activity Barriers & Risk Stratification:     Activity Barriers & Cardiac Risk Stratification - 01/16/16 1223      Activity Barriers & Cardiac Risk Stratification   Activity Barriers Arthritis;Joint Problems;Back Problems;Deconditioning;Muscular Weakness;Left Knee Replacement;Decreased Ventricular Function;Shortness of Breath   Cardiac Risk Stratification High      6 Minute Walk:     6 Minute Walk    Row Name 01/16/16 1405 04/05/16 1133       6 Minute Walk   Phase Initial Discharge    Distance 1170 feet 1235 feet    Walk  Time 6 minutes 6 minutes    # of Rest Breaks 0 0    MPH 2.22 2.34    METS 2.68 2.76    RPE 13 13    Perceived Dyspnea  4  -    VO2 Peak 7.82 8.5    Symptoms Yes (comment) Yes (comment)    Comments SOB, chronic hip pain (2/10), chronic right ankle pain (2/10) knees hurt    Resting HR 75 bpm 99 bpm    Resting BP 116/64 124/80    Max Ex. HR 109 bpm 114 bpm    Max Ex. BP 146/74 136/64    2 Minute Post BP 136/74  -       Initial Exercise Prescription:     Initial Exercise Prescription - 01/16/16 1400      Date of Initial Exercise RX and Referring Provider   Date 01/16/16   Referring Provider Serafina Royals MD     Treadmill   MPH 2   Grade 0   Minutes 15   METs 2.53     NuStep   Level 1   Minutes 15   METs 2     Biostep-RELP   Level 1   Minutes 15   METs 2     Prescription Details   Frequency (times per week) 3   Duration Progress to 45 minutes of aerobic exercise without signs/symptoms of physical distress     Intensity   THRR 40-80% of Max Heartrate 107-138   Ratings of Perceived Exertion 11-15   Perceived Dyspnea 0-4     Progression   Progression Continue to progress workloads to maintain intensity without signs/symptoms of physical distress.     Resistance Training   Training Prescription Yes   Weight 3 lbs   Reps 10-12      Perform Capillary Blood Glucose checks as needed.  Exercise Prescription Changes:     Exercise Prescription Changes    Row Name 01/16/16 1400 01/25/16 1300 02/09/16 1300 02/23/16 1100 04/05/16 1100     Exercise Review   Progression -  Walk test results  - Yes Yes  -     Response to Exercise   Blood Pressure (Admit) 116/64 140/68 128/78 118/64 124/80   Blood Pressure (Exercise) 146/74 162/88 118/64 130/68 136/64   Blood Pressure (Exit) 136/74 132/60 124/78 122/64 128/70   Heart Rate (Admit) 75 bpm 88 bpm 56 bpm 123 bpm 99 bpm   Heart Rate (Exercise) 109 bpm 125 bpm 140 bpm 128 bpm 126 bpm   Heart Rate (Exit) 85 bpm 97  bpm  - 108 bpm 96 bpm   Oxygen Saturation (Admit) 97 %  -  -  -  -   Oxygen Saturation (Exercise) 99 %  -  -  -  -   Rating of Perceived Exertion (Exercise) '13 11 12 13 12   '$ Perceived Dyspnea (Exercise)  4  -  -  -  -   Symptoms SOB, chronic pain in hips and right ankle none none  - none   Duration  - Progress to 45 minutes of aerobic exercise without signs/symptoms of physical distress Progress to 45 minutes of aerobic exercise without signs/symptoms of physical distress Progress to 45 minutes of aerobic exercise without signs/symptoms of physical distress Progress to 45 minutes of aerobic exercise without signs/symptoms of physical distress   Intensity  - THRR unchanged THRR unchanged THRR unchanged THRR unchanged     Progression   Progression  - Continue to progress workloads to maintain intensity without signs/symptoms of physical distress. Continue to progress workloads to maintain intensity without signs/symptoms of physical distress. Continue to progress workloads to maintain intensity without signs/symptoms of physical distress. Continue to progress workloads to maintain intensity without signs/symptoms of physical distress.   Average METs  -  -  - 2.85 2.81     Resistance Training   Training Prescription  - Yes Yes Yes Yes   Weight  - '3 3 4 4   '$ Reps  - 10-12 10-12 10-12 10-12     Interval Training   Interval Training  - No No  -  -     Treadmill   MPH  - '2 2 2 2   '$ Grade  - 0 '1 1 1   '$ Minutes  - '15 15 15 15   '$ METs  - 2.53 2.81 2.81 2.81     NuStep   Level  - '1 3 3  '$ -   Minutes  - '15 15 15  '$ -   METs  - 2.1 1.7 2.9  -      Exercise Comments:     Exercise Comments    Row Name 01/16/16 1409 01/25/16 1401 02/09/16 1332 02/23/16 1145 03/07/16 1807   Exercise Comments Jayln wants to get into better overall shape and lose weight. Candelario did very well in his first week of exercise. Kenneth Hahn has progressed well with exercise.  He has had some knee pain that limits his walking more than  other equipment. Kenneth Hahn continues to progress well with exercise. Kenneth Hahn attended education portion of class today only--he has not received clearance for exercise yet.   [place] Name 03/09/16 1253 04/05/16 1141         Exercise Comments Tristen has not been cleared to return to exercise yet by Dr Nehemiah Massed.  He expects to be back in the next 2 weeks. Kenneth Hahn has just returned after missing sessions due to an incident of a-fib that is now under control.  He will graduate in 2 sessions.         Discharge Exercise Prescription (Final Exercise Prescription Changes):     Exercise Prescription Changes - 04/05/16 1100      Response to Exercise   Blood Pressure (Admit) 124/80   Blood Pressure (Exercise) 136/64   Blood Pressure (Exit) 128/70   Heart Rate (Admit) 99 bpm   Heart Rate (Exercise) 126 bpm   Heart Rate (Exit) 96 bpm   Rating of Perceived Exertion (Exercise) 12   Symptoms none   Duration Progress to 45 minutes of aerobic exercise without signs/symptoms of physical distress   Intensity THRR unchanged     Progression   Progression Continue to progress workloads to maintain intensity without signs/symptoms of physical distress.   Average METs 2.81     Resistance Training   Training Prescription Yes   Weight 4   Reps  10-12     Treadmill   MPH 2   Grade 1   Minutes 15   METs 2.81      Nutrition:  Target Goals: Understanding of nutrition guidelines, daily intake of sodium '1500mg'$ , cholesterol '200mg'$ , calories 30% from fat and 7% or less from saturated fats, daily to have 5 or more servings of fruits and vegetables.  Biometrics:     Pre Biometrics - 01/16/16 1412      Pre Biometrics   Height 5' 9.9" (1.775 m)   Weight 289 lb 3.2 oz (131.2 kg)   Waist Circumference 49 inches   Hip Circumference 48.75 inches   Waist to Hip Ratio 1.01 %   BMI (Calculated) 41.7   Single Leg Stand 1.17 seconds       Nutrition Therapy Plan and Nutrition Goals:     Nutrition Therapy & Goals -  01/20/16 1451      Nutrition Therapy   Diet 1700kcal diabetes diet with DASH diet guidelines   Drug/Food Interactions Statins/Certain Fruits;Coumadin/Vit K   Protein (specify units) 8oz   Fiber 30 grams   Whole Grain Foods 3 servings   Saturated Fats 14 max. grams   Fruits and Vegetables 5 servings/day   Sodium 1500 grams     Personal Nutrition Goals   Personal Goal #1 Work on portion control -- especially starches, meats -- eat generous portions of vegetables.   Personal Goal #2 Try eating slower by drinking water during meals, taking small bites, and/or chewing thoroughly.   Personal Goal #3 Continue with regular exercise     Intervention Plan   Intervention Prescribe, educate and counsel regarding individualized specific dietary modifications aiming towards targeted core components such as weight, hypertension, lipid management, diabetes, heart failure and other comorbidities.;Nutrition handout(s) given to patient.   Expected Outcomes Short Term Goal: Understand basic principles of dietary content, such as calories, fat, sodium, cholesterol and nutrients.;Short Term Goal: A plan has been developed with personal nutrition goals set during dietitian appointment.;Long Term Goal: Adherence to prescribed nutrition plan.      Nutrition Discharge: Rate Your Plate Scores:     Nutrition Assessments - 04/04/16 1832      Rate Your Plate Scores   Post Score 61   Post Score % 67.7 %      Nutrition Goals Re-Evaluation:     Nutrition Goals Re-Evaluation    Row Name 02/22/16 2458 04/02/16 1708           Personal Goal #1 Re-Evaluation   Personal Goal #1 Work on portion control -- especially starches, meats -- eat generous portions of vegetables.  -      Goal Progress Seen Yes Yes      Comments Portions are smaller  Goal to continue to work on decreasing carbs and portions Was in the hospital so a little harder and HBA1c is up but he is working on this.         Personal Goal #2  Re-Evaluation   Personal Goal #2 Try eating slower by drinking water during meals, taking small bites, and/or chewing thoroughly.  -      Goal Progress Seen No  -      Comments States is trying to work on this goal.  Drinking alot of diet soda and water in between.   Goal to continue to work on adding more water to daily meals  -        Personal Goal #3 Re-Evaluation   Personal Goal #3  Continue with regular exercise  -      Goal Progress Seen Yes  -      Comments Exercise  three days with program. Goal continue the exercise program  -         Psychosocial: Target Goals: Acknowledge presence or absence of depression, maximize coping skills, provide positive support system. Participant is able to verbalize types and ability to use techniques and skills needed for reducing stress and depression.  Initial Review & Psychosocial Screening:     Initial Psych Review & Screening - 01/16/16 1226      Family Dynamics   Good Support System? Yes     Screening Interventions   Interventions Encouraged to exercise      Quality of Life Scores:     Quality of Life - 04/04/16 1834      Quality of Life Scores   Health/Function Post 28 %   Socioeconomic Post 30 %   Psych/Spiritual Post 30 %   Family Post 30 %   GLOBAL Post 29.14 %      PHQ-9: Recent Review Flowsheet Data    Depression screen [facility] 2/9 04/04/2016 01/16/2016   Decreased Interest 0 0   Down, Depressed, Hopeless 0 0   PHQ - 2 Score 0 0   Altered sleeping 1 0   Tired, decreased energy 1 2   Change in appetite 0 2   Feeling bad or failure about yourself  1 0   Trouble concentrating 1 0   Moving slowly or fidgety/restless 0 1   Suicidal thoughts 0 0   PHQ-9 Score 4 5   Difficult doing work/chores Not difficult at all Not difficult at all      Psychosocial Evaluation and Intervention:     Psychosocial Evaluation - 01/23/16 1710      Psychosocial Evaluation & Interventions   Interventions Encouraged to exercise with  the program and follow exercise prescription   Comments Counselor met with Mr. Chauncey Cruel today for initial psychosocial evaluation.  He will be 66 years old next week and had a stent inserted several weeks ago.  He is married and his wife's adult children live close by.  Mr. Chauncey Cruel has adult children in the [place] area.  His first wife passed in 2008.  Mr. Chauncey Cruel had a knee replacement in 2016 and he currently has diabetes as well as heart problems.  He reports sleeping "too much," although he is up and down urinating frequently in the night - he is in the bed for 12-14 hours typically.  Mr. Chauncey Cruel states he is going to check with the Dr. about this being a possible medication reaction to one of his heart meds.  His appetite has decreased over the past several years; although he continues to contend with being overweight.  He denies a history or current symptoms of depression or anxiety.  He states his mood is typically positive and is even more positive when he isn't feeling so tired.  Mr. Chauncey Cruel reports he has minimal stress other than his health currently.  He has goals to increase his stamina and strength and have more energy while in this class.  Counselor encouraged him to check with his Dr. soon about the medications to see if this will improve his energy levels and decrease the amount of time he is in the bed each day.  Staff will continue to follow with Mr. Chauncey Cruel throughout the course of this program.  Psychosocial Re-Evaluation:     Psychosocial Re-Evaluation    Row Name 02/15/16 1650 04/02/16 1711           Psychosocial Re-Evaluation   Comments Counselor Follow up with Mr. Chauncey Cruel today to see if he was sleeping any better since beginning this class and he stated the "quality" of his sleep has improved.  He also reports he does not like to come to exercise but he notices how much better he feels immediately after the class and that inspires him to keep coming.  He is still wondering if one of his medications is causing  tiredness, but he also thinks it can just be a combination of his heart and his diabetes.  He is working on portion control to help with weight loss and has met with the dietician.  Counselor commended him on his commitment to exercise and improved health.  He did sleep better last night and only woke up once last night. He is still sleeping alot but will try to exercise more.          Vocational Rehabilitation: Provide vocational rehab assistance to qualifying candidates.   Vocational Rehab Evaluation & Intervention:     Vocational Rehab - 01/16/16 1224      Initial Vocational Rehab Evaluation & Intervention   Assessment shows need for Vocational Rehabilitation No      Education: Education Goals: Education classes will be provided on a Hahn basis, covering required topics. Participant will state understanding/return demonstration of topics presented.  Learning Barriers/Preferences:     Learning Barriers/Preferences - 01/16/16 1223      Learning Barriers/Preferences   Learning Barriers None   Learning Preferences Computer/Internet;Pictoral;Video      Education Topics: General Nutrition Guidelines/Fats and Fiber: -Group instruction provided by verbal, written material, models and posters to present the general guidelines for heart healthy nutrition. Gives an explanation and review of dietary fats and fiber. Flowsheet Row Cardiac Rehab from 04/04/2016 in [facility] Cardiac and Pulmonary Rehab  Date  04/02/16  Educator  PI  Instruction Review Code  2- meets goals/outcomes      Controlling Sodium/Reading Food Labels: -Group verbal and written material supporting the discussion of sodium use in heart healthy nutrition. Review and explanation with models, verbal and written materials for utilization of the food label. Flowsheet Row Cardiac Rehab from 04/04/2016 in [facility] Cardiac and Pulmonary Rehab  Date  02/13/16  Educator  PI  Instruction Review Code  2- meets goals/outcomes       Exercise Physiology & Risk Factors: - Group verbal and written instruction with models to review the exercise physiology of the cardiovascular system and associated critical values. Details cardiovascular disease risk factors and the goals associated with each risk factor. Flowsheet Row Cardiac Rehab from 04/04/2016 in [facility] Cardiac and Pulmonary Rehab  Date  02/01/16  Educator  AS  Instruction Review Code  2- meets goals/outcomes      Aerobic Exercise & Resistance Training: - Gives group verbal and written discussion on the health impact of inactivity. On the components of aerobic and resistive training programs and the benefits of this training and how to safely progress through these programs. Flowsheet Row Cardiac Rehab from 04/04/2016 in [facility] Cardiac and Pulmonary Rehab  Date  02/20/16  Educator  K. Amedeo Plenty  Instruction Review Code  2- meets goals/outcomes      Flexibility, Balance, General Exercise Guidelines: - Provides group verbal and written instruction on the benefits of flexibility and balance training programs. Provides general  exercise guidelines with specific guidelines to those with heart or lung disease. Demonstration and skill practice provided. Flowsheet Row Cardiac Rehab from 04/04/2016 in [facility] Cardiac and Pulmonary Rehab  Date  02/22/16  Educator  AS  Instruction Review Code  2- meets goals/outcomes      Stress Management: - Provides group verbal and written instruction about the health risks of elevated stress, cause of high stress, and healthy ways to reduce stress. Flowsheet Row Cardiac Rehab from 04/04/2016 in [facility] Cardiac and Pulmonary Rehab  Date  03/07/16  Educator  [facility]  Instruction Review Code  2- meets goals/outcomes      Depression: - Provides group verbal and written instruction on the correlation between heart/lung disease and depressed mood, treatment options, and the stigmas associated with seeking treatment. Flowsheet Row Cardiac Rehab from  04/04/2016 in [facility] Cardiac and Pulmonary Rehab  Date  04/04/16  Educator  Heloise Ochoa, MSW2  Instruction Review Code  2- meets goals/outcomes      Anatomy & Physiology of the Heart: - Group verbal and written instruction and models provide basic cardiac anatomy and physiology, with the coronary electrical and arterial systems. Review of: AMI, Angina, Valve disease, Heart Failure, Cardiac Arrhythmia, Pacemakers, and the ICD. Flowsheet Row Cardiac Rehab from 04/04/2016 in [facility] Cardiac and Pulmonary Rehab  Date  02/27/16  Educator  SB  Instruction Review Code  2- meets goals/outcomes      Cardiac Procedures: - Group verbal and written instruction and models to describe the testing methods done to diagnose heart disease. Reviews the outcomes of the test results. Describes the treatment choices: Medical Management, Angioplasty, or Coronary Bypass Surgery.   Cardiac Medications: - Group verbal and written instruction to review commonly prescribed medications for heart disease. Reviews the medication, class of the drug, and side effects. Includes the steps to properly store meds and maintain the prescription regimen. Flowsheet Row Cardiac Rehab from 04/04/2016 in [facility] Cardiac and Pulmonary Rehab  Date  01/23/16 [part1]  Educator  SB  Instruction Review Code  2- meets goals/outcomes      Go Sex-Intimacy & Heart Disease, Get SMART - Goal Setting: - Group verbal and written instruction through game format to discuss heart disease and the return to sexual intimacy. Provides group verbal and written material to discuss and apply goal setting through the application of the S.M.A.R.T. Method.   Other Matters of the Heart: - Provides group verbal, written materials and models to describe Heart Failure, Angina, Valve Disease, and Diabetes in the realm of heart disease. Includes description of the disease process and treatment options available to the cardiac patient. Flowsheet Row Cardiac Rehab  from 04/04/2016 in [facility] Cardiac and Pulmonary Rehab  Date  02/27/16  Educator  SB  Instruction Review Code  2- meets goals/outcomes      Exercise & Equipment Safety: - Individual verbal instruction and demonstration of equipment use and safety with use of the equipment. Flowsheet Row Cardiac Rehab from 04/04/2016 in [facility] Cardiac and Pulmonary Rehab  Date  01/16/16  Educator  C. EnterkinRN  Instruction Review Code  2- meets goals/outcomes      Infection Prevention: - Provides verbal and written material to individual with discussion of infection control including proper hand washing and proper equipment cleaning during exercise session. Flowsheet Row Cardiac Rehab from 04/04/2016 in [facility] Cardiac and Pulmonary Rehab  Date  01/16/16  Educator  C. EnterkinRN  Instruction Review Code  2- meets goals/outcomes      Falls Prevention: -  Provides verbal and written material to individual with discussion of falls prevention and safety. Flowsheet Row Cardiac Rehab from 04/04/2016 in [facility] Cardiac and Pulmonary Rehab  Date  01/16/16  Educator  C. [place]  Instruction Review Code  2- meets goals/outcomes      Diabetes: - Individual verbal and written instruction to review signs/symptoms of diabetes, desired ranges of glucose level fasting, after meals and with exercise. Advice that pre and post exercise glucose checks will be done for 3 sessions at entry of program. [place] from 04/04/2016 in [facility] Cardiac and Pulmonary Rehab  Date  01/16/16  Educator  C. [place]  Instruction Review Code  2- meets goals/outcomes       Knowledge Questionnaire Score:     Knowledge Questionnaire Score - 04/04/16 1832      Knowledge Questionnaire Score   Post Score 24      Core Components/Risk Factors/Patient Goals at Admission:     Personal Goals and Risk Factors at Admission - 01/16/16 1225      Core Components/Risk Factors/Patient Goals on Admission    Weight Management  Yes;Obesity;Weight Loss   Intervention Weight Management: Develop a combined nutrition and exercise program designed to reach desired caloric intake, while maintaining appropriate intake of nutrient and fiber, sodium and fats, and appropriate energy expenditure required for the weight goal.;Weight Management: Provide education and appropriate resources to help participant work on and attain dietary goals.;Weight Management/Obesity: Establish reasonable short term and long term weight goals.;Obesity: Provide education and appropriate resources to help participant work on and attain dietary goals.   Admit Weight 289 lb 3.2 oz (131.2 kg)   Goal Weight: Short Term 285 lb (129.3 kg)   Goal Weight: Long Term 220 lb (99.8 kg)   Expected Outcomes Short Term: Continue to assess and modify interventions until short term weight is achieved;Long Term: Adherence to nutrition and physical activity/exercise program aimed toward attainment of established weight goal;Weight Loss: Understanding of general recommendations for a balanced deficit meal plan, which promotes 1-2 lb weight loss per week and includes a negative energy balance of [phone] kcal/d;Understanding recommendations for meals to include 15-35% energy as protein, 25-35% energy from fat, 35-60% energy from carbohydrates, less than '200mg'$  of dietary cholesterol, 20-35 gm of total fiber daily;Understanding of distribution of calorie intake throughout the day with the consumption of 4-5 meals/snacks   Sedentary Yes   Intervention Provide advice, education, support and counseling about physical activity/exercise needs.;Develop an individualized exercise prescription for aerobic and resistive training based on initial evaluation findings, risk stratification, comorbidities and participant's personal goals.   Expected Outcomes Achievement of increased cardiorespiratory fitness and enhanced flexibility, muscular endurance and strength shown through measurements of  functional capacity and personal statement of participant.   Increase Strength and Stamina Yes   Intervention Provide advice, education, support and counseling about physical activity/exercise needs.;Develop an individualized exercise prescription for aerobic and resistive training based on initial evaluation findings, risk stratification, comorbidities and participant's personal goals.   Expected Outcomes Achievement of increased cardiorespiratory fitness and enhanced flexibility, muscular endurance and strength shown through measurements of functional capacity and personal statement of participant.   Diabetes Yes   Intervention Provide education about signs/symptoms and action to take for hypo/hyperglycemia.;Provide education about proper nutrition, including hydration, and aerobic/resistive exercise prescription along with prescribed medications to achieve blood glucose in normal ranges: Fasting glucose 65-99 mg/dL   Expected Outcomes Short Term: Participant verbalizes understanding of the signs/symptoms and immediate care of hyper/hypoglycemia, proper foot  care and importance of medication, aerobic/resistive exercise and nutrition plan for blood glucose control.;Long Term: Attainment of HbA1C < 7%.   Heart Failure Yes   Intervention Provide a combined exercise and nutrition program that is supplemented with education, support and counseling about heart failure. Directed toward relieving symptoms such as shortness of breath, decreased exercise tolerance, and extremity edema.   Expected Outcomes Improve functional capacity of life;Short term: Attendance in program 2-3 days a week with increased exercise capacity. Reported lower sodium intake. Reported increased fruit and vegetable intake. Reports medication compliance.;Short term: Daily weights obtained and reported for increase. Utilizing diuretic protocols set by physician.;Long term: Adoption of self-care skills and reduction of barriers for early  signs and symptoms recognition and intervention leading to self-care maintenance.   Hypertension Yes   Intervention Provide education on lifestyle modifcations including regular physical activity/exercise, weight management, moderate sodium restriction and increased consumption of fresh fruit, vegetables, and low fat dairy, alcohol moderation, and smoking cessation.;Monitor prescription use compliance.   Expected Outcomes Short Term: Continued assessment and intervention until BP is < 140/35m HG in hypertensive participants. < 130/886mHG in hypertensive participants with diabetes, heart failure or chronic kidney disease.;Long Term: Maintenance of blood pressure at goal levels.   Lipids Yes   Intervention Provide education and support for participant on nutrition & aerobic/resistive exercise along with prescribed medications to achieve LDL '70mg'$ , HDL >'40mg'$ .   Expected Outcomes Short Term: Participant states understanding of desired cholesterol values and is compliant with medications prescribed. Participant is following exercise prescription and nutrition guidelines.;Long Term: Cholesterol controlled with medications as prescribed, with individualized exercise RX and with personalized nutrition plan. Value goals: LDL < '70mg'$ , HDL > 40 mg.      Core Components/Risk Factors/Patient Goals Review:      Goals and Risk Factor Review    Row Name 01/27/16 1416 02/22/16 1715 02/22/16 1719 04/02/16 1709       Core Components/Risk Factors/Patient Goals Review   Personal Goals Review Weight Management/Obesity;Increase Strength and Stamina;Lipids;Hypertension;Diabetes Weight Management/Obesity;Hypertension;Lipids;Heart Failure;Diabetes  - Weight Management/Obesity    Review Kenneth Hahn weight is currently 286.  he has met with the dietician and confirmed his knowledge of ways to manage his DM.  He is also watching his food intake.    He is exercising 3 days per week and reports feeling better after exercise.  HTN and  cholesterol are the same and he is taking meds as directed.   - Kenneth Hahn his weight is running up and down, though hehas maintained 3 pounds down. He is working on his nutrition goals and is attending class for the exercise.  He stated that his energy levels do flucuate, and he thinks he has more stamina than he did when he started,. He feels the exercise has been helful. He continues to maintain BP, chol diabetes in good range with the medications, nutrition and exercise. Was in the hospital so a little harder and HBA1c is up but he is working on this. Kenneth Hahn see Cardiologist in the am again to evaluate his holter monitor.     Expected Outcomes Kenneth Hahn will continue to praactice healthier habits with diet and exercise and see an overall improvement in his health.   - Kenneth Hahn will continue to practice healthier habits with diet and exercise and see an overall improvement in his health.  Heart healthy life       Core Components/Risk Factors/Patient Goals at Discharge (Final Review):      Goals and Risk Factor  Review - 04/02/16 1709      Core Components/Risk Factors/Patient Goals Review   Personal Goals Review Weight Management/Obesity   Review Was in the hospital so a little harder and HBA1c is up but he is working on this. Kenneth Hahn will see Cardiologist in the am again to evaluate his holter monitor.    Expected Outcomes Heart healthy life      ITP Comments:     ITP Comments    Row Name 01/16/16 1224 01/16/16 1258 02/01/16 0646 02/29/16 0640 02/29/16 1731   ITP Comments Kenneth Hahn said he knows what to eat but he loves carbs like bread. He doesn't really like vegetables. ITP created during Cardiac Medical Review appt after the Cardiac Rehab informed consent was signed.  30 day review completed for Medical Director physician review and signature. Continue ITP unless changes made by physician. 30 day review completed for review by Dr Emily Filbert.  Continue with ITP unless changes noted by Dr Sabra Heck.  Heart rate 150 upon arrival to Cardiac REhab. Walked slow and heart rate never below 130 blood pressure was stable at 142/80. Taken to [employer] [facility] via wheelchair since after 3 glasses of 240 cc of water his heart rate was still 130. Kenneth Hahn admits he was up alot last night since his blood sugar was up.    [place] Name 03/05/16 1130 03/09/16 1254 03/23/16 1449 03/27/16 0650 04/02/16 1710   ITP Comments I just called to check on Kenneth Hahn and he said he did get admitted to the hospital after I took him to the [facility] with heart rate 130-150. He is still on oxgyen which was started in the hospital so I instructed him on pursed lip breathing over the phone. Kenneth Hahn said he will not be in Cardiac Rehab this afternoon but hopes to be back on Wed. Kenneth Hahn has not been cleared to return to exercise yet by Dr Nehemiah Massed.  He expects to be back in the next 2 weeks. Kenneth Hahn had to wear a 24/48hr holter.  He was found to be in afib.  He has another follow up on Jan 9.  Hopefully will be able to return after that. 30 day review. Continue with ITP unless directed changes per Medical Director review. I encouraged Jasean when he felt like he wanted a nap to walk for 30 minutes. His wife goes to the gym 3 times a week but he doesn't feel he can go so will try to walk at home or use his home exercise equipment. He is being worked up for [facility]      Comments:

## 2016-04-09 ENCOUNTER — Encounter: Payer: Medicare Other | Admitting: *Deleted

## 2016-04-09 DIAGNOSIS — Z955 Presence of coronary angioplasty implant and graft: Secondary | ICD-10-CM

## 2016-04-09 NOTE — Progress Notes (Signed)
Cardiac Individual Treatment Plan  Patient Details  Name: Clayburn Weekly MRN: [id number] Date of Birth: May 20, 1950 Referring Provider:   Flowsheet Row Cardiac Rehab from 01/16/2016 in [facility] Cardiac and Pulmonary Rehab  Referring Provider  Serafina Royals MD      Initial Encounter Date:  Flowsheet Row Cardiac Rehab from 01/16/2016 in [facility] Cardiac and Pulmonary Rehab  Date  01/16/16  Referring Provider  Serafina Royals MD      Visit Diagnosis: Status post coronary artery stent placement  Patient's Home Medications on Admission:  Current Outpatient Prescriptions:  .  acetaminophen (TYLENOL) 500 MG tablet, Take 1,000 mg by mouth daily., Disp: , Rfl:  .  atenolol (TENORMIN) 25 MG tablet, Take 25 mg by mouth 2 (two) times daily. Take one tablet morning and evening, Disp: , Rfl:  .  atorvastatin (LIPITOR) 80 MG tablet, Take 1 tablet (80 mg total) by mouth daily at 6 PM., Disp: 90 tablet, Rfl: 4 .  benazepril (LOTENSIN) 20 MG tablet, Take 20 mg by mouth daily., Disp: , Rfl:  .  Cholecalciferol (VITAMIN D-3) 1000 units CAPS, Take 1 capsule by mouth daily. , Disp: , Rfl:  .  clopidogrel (PLAVIX) 75 MG tablet, Take 1 tablet (75 mg total) by mouth daily with breakfast., Disp: 90 tablet, Rfl: 4 .  diltiazem (CARDIZEM CD) 240 MG 24 hr capsule, Take 1 capsule (240 mg total) by mouth daily., Disp: 30 capsule, Rfl: 2 .  furosemide (LASIX) 20 MG tablet, Take 20 mg by mouth daily., Disp: , Rfl:  .  insulin glargine (LANTUS) 100 UNIT/ML injection, Inject 0.62 mLs (62 Units total) into the skin at bedtime. (Patient taking differently: Inject 52 Units into the skin at bedtime. ), Disp: 10 mL, Rfl: 11 .  metFORMIN (GLUCOPHAGE-XR) 500 MG 24 hr tablet, Take 500 mg by mouth every evening., Disp: , Rfl:  .  Multiple Vitamin (MULTIVITAMIN) tablet, Take 1 tablet by mouth daily., Disp: , Rfl:  .  Multiple Vitamins-Minerals (PRESERVISION AREDS 2+MULTI VIT) CAPS, Take 1 capsule by mouth 2 (two) times daily.,  Disp: , Rfl:  .  ondansetron (ZOFRAN) 4 MG/2ML SOLN injection, Inject 2 mLs (4 mg total) into the vein every 6 (six) hours as needed for nausea. (Patient not taking: Reported on 01/16/2016), Disp: 2 mL, Rfl: 0 .  tamsulosin (FLOMAX) 0.4 MG CAPS capsule, Take 0.4 mg by mouth at bedtime., Disp: , Rfl:  .  warfarin (COUMADIN) 3 MG tablet, Take 3-4.5 mg by mouth one time only at 6 PM. Take 71m every day, except Thursday, take 4.550m Disp: , Rfl:   Past Medical History: Past Medical History:  Diagnosis Date  . [place]  . [place] polyps   . [place] ([place]  . Hyperlipemia   . Hypertension     Tobacco Use: History  Smoking Status  . Former Smoker  . Quit date: 03/26/1973  Smokeless Tobacco  . Never Used    Labs: Recent Review Flowsheet Data    There is no flowsheet data to display.       Exercise Target Goals:    Exercise Program Goal: Individual exercise prescription set with THRR, safety & activity barriers. Participant demonstrates ability to understand and report RPE using BORG scale, to self-measure pulse accurately, and to acknowledge the importance of the exercise prescription.  Exercise Prescription Goal: Starting with aerobic activity 30 plus minutes a day, 3 days per week for initial exercise prescription. Provide home exercise prescription and guidelines that participant acknowledges  understanding prior to discharge.  Activity Barriers & Risk Stratification:     Activity Barriers & Cardiac Risk Stratification - 01/16/16 1223      Activity Barriers & Cardiac Risk Stratification   Activity Barriers Arthritis;Joint Problems;Back Problems;Deconditioning;Muscular Weakness;Left Knee Replacement;Decreased Ventricular Function;Shortness of Breath   Cardiac Risk Stratification High      6 Minute Walk:     6 Minute Walk    Row Name 01/16/16 1405 04/05/16 1133       6 Minute Walk   Phase Initial Discharge    Distance 1170 feet 1235 feet    Walk  Time 6 minutes 6 minutes    # of Rest Breaks 0 0    MPH 2.22 2.34    METS 2.68 2.76    RPE 13 13    Perceived Dyspnea  4  -    VO2 Peak 7.82 8.5    Symptoms Yes (comment) Yes (comment)    Comments SOB, chronic hip pain (2/10), chronic right ankle pain (2/10) knees hurt    Resting HR 75 bpm 99 bpm    Resting BP 116/64 124/80    Max Ex. HR 109 bpm 114 bpm    Max Ex. BP 146/74 136/64    2 Minute Post BP 136/74  -       Initial Exercise Prescription:     Initial Exercise Prescription - 01/16/16 1400      Date of Initial Exercise RX and Referring Provider   Date 01/16/16   Referring Provider Serafina Royals MD     Treadmill   MPH 2   Grade 0   Minutes 15   METs 2.53     NuStep   Level 1   Minutes 15   METs 2     Biostep-RELP   Level 1   Minutes 15   METs 2     Prescription Details   Frequency (times per week) 3   Duration Progress to 45 minutes of aerobic exercise without signs/symptoms of physical distress     Intensity   THRR 40-80% of Max Heartrate 107-138   Ratings of Perceived Exertion 11-15   Perceived Dyspnea 0-4     Progression   Progression Continue to progress workloads to maintain intensity without signs/symptoms of physical distress.     Resistance Training   Training Prescription Yes   Weight 3 lbs   Reps 10-12      Perform Capillary Blood Glucose checks as needed.  Exercise Prescription Changes:     Exercise Prescription Changes    Row Name 01/16/16 1400 01/25/16 1300 02/09/16 1300 02/23/16 1100 04/05/16 1100     Exercise Review   Progression -  Walk test results  - Yes Yes  -     Response to Exercise   Blood Pressure (Admit) 116/64 140/68 128/78 118/64 124/80   Blood Pressure (Exercise) 146/74 162/88 118/64 130/68 136/64   Blood Pressure (Exit) 136/74 132/60 124/78 122/64 128/70   Heart Rate (Admit) 75 bpm 88 bpm 56 bpm 123 bpm 99 bpm   Heart Rate (Exercise) 109 bpm 125 bpm 140 bpm 128 bpm 126 bpm   Heart Rate (Exit) 85 bpm 97  bpm  - 108 bpm 96 bpm   Oxygen Saturation (Admit) 97 %  -  -  -  -   Oxygen Saturation (Exercise) 99 %  -  -  -  -   Rating of Perceived Exertion (Exercise) '13 11 12 13 12   '$ Perceived Dyspnea (Exercise)  4  -  -  -  -   Symptoms SOB, chronic pain in hips and right ankle none none  - none   Duration  - Progress to 45 minutes of aerobic exercise without signs/symptoms of physical distress Progress to 45 minutes of aerobic exercise without signs/symptoms of physical distress Progress to 45 minutes of aerobic exercise without signs/symptoms of physical distress Progress to 45 minutes of aerobic exercise without signs/symptoms of physical distress   Intensity  - THRR unchanged THRR unchanged THRR unchanged THRR unchanged     Progression   Progression  - Continue to progress workloads to maintain intensity without signs/symptoms of physical distress. Continue to progress workloads to maintain intensity without signs/symptoms of physical distress. Continue to progress workloads to maintain intensity without signs/symptoms of physical distress. Continue to progress workloads to maintain intensity without signs/symptoms of physical distress.   Average METs  -  -  - 2.85 2.81     Resistance Training   Training Prescription  - Yes Yes Yes Yes   Weight  - '3 3 4 4   '$ Reps  - 10-12 10-12 10-12 10-12     Interval Training   Interval Training  - No No  -  -     Treadmill   MPH  - '2 2 2 2   '$ Grade  - 0 '1 1 1   '$ Minutes  - '15 15 15 15   '$ METs  - 2.53 2.81 2.81 2.81     NuStep   Level  - '1 3 3  '$ -   Minutes  - '15 15 15  '$ -   METs  - 2.1 1.7 2.9  -      Exercise Comments:     Exercise Comments    Row Name 01/16/16 1409 01/25/16 1401 02/09/16 1332 02/23/16 1145 03/07/16 1807   Exercise Comments Pedro wants to get into better overall shape and lose weight. Raymondo did very well in his first week of exercise. Joe has progressed well with exercise.  He has had some knee pain that limits his walking more than  other equipment. Joe continues to progress well with exercise. Joe attended education portion of class today only--he has not received clearance for exercise yet.   [place] Name 03/09/16 1253 04/05/16 1141 04/09/16 1819       Exercise Comments Aldyn has not been cleared to return to exercise yet by Dr Nehemiah Massed.  He expects to be back in the next 2 weeks. Joe has just returned after missing sessions due to an incident of a-fib that is now under control.  He will graduate in 2 sessions. Shunsuke graduated today from cardiac rehab with 36 sessions completed.  Details of the patient's exercise prescription and what He needs to do in order to continue the prescription and progress were discussed with patient.  Patient was given a copy of prescription and goals.  Patient verbalized understanding.  Braylon plans to continue to exercise by walking at home and using his eliptical.        Discharge Exercise Prescription (Final Exercise Prescription Changes):     Exercise Prescription Changes - 04/05/16 1100      Response to Exercise   Blood Pressure (Admit) 124/80   Blood Pressure (Exercise) 136/64   Blood Pressure (Exit) 128/70   Heart Rate (Admit) 99 bpm   Heart Rate (Exercise) 126 bpm   Heart Rate (Exit) 96 bpm   Rating of Perceived Exertion (Exercise) 12   Symptoms none  Duration Progress to 45 minutes of aerobic exercise without signs/symptoms of physical distress   Intensity THRR unchanged     Progression   Progression Continue to progress workloads to maintain intensity without signs/symptoms of physical distress.   Average METs 2.81     Resistance Training   Training Prescription Yes   Weight 4   Reps 10-12     Treadmill   MPH 2   Grade 1   Minutes 15   METs 2.81      Nutrition:  Target Goals: Understanding of nutrition guidelines, daily intake of sodium '1500mg'$ , cholesterol '200mg'$ , calories 30% from fat and 7% or less from saturated fats, daily to have 5 or more servings of  fruits and vegetables.  Biometrics:     Pre Biometrics - 01/16/16 1412      Pre Biometrics   Height 5' 9.9" (1.775 m)   Weight 289 lb 3.2 oz (131.2 kg)   Waist Circumference 49 inches   Hip Circumference 48.75 inches   Waist to Hip Ratio 1.01 %   BMI (Calculated) 41.7   Single Leg Stand 1.17 seconds         Post Biometrics - 04/06/16 1022       Post  Biometrics   Height 5' 9.9" (1.775 m)   Waist Circumference 49 inches   Hip Circumference 49 inches   Waist to Hip Ratio 1 %   Single Leg Stand 3.1 seconds      Nutrition Therapy Plan and Nutrition Goals:     Nutrition Therapy & Goals - 01/20/16 1451      Nutrition Therapy   Diet 1700kcal diabetes diet with DASH diet guidelines   Drug/Food Interactions Statins/Certain Fruits;Coumadin/Vit K   Protein (specify units) 8oz   Fiber 30 grams   Whole Grain Foods 3 servings   Saturated Fats 14 max. grams   Fruits and Vegetables 5 servings/day   Sodium 1500 grams     Personal Nutrition Goals   Personal Goal #1 Work on portion control -- especially starches, meats -- eat generous portions of vegetables.   Personal Goal #2 Try eating slower by drinking water during meals, taking small bites, and/or chewing thoroughly.   Personal Goal #3 Continue with regular exercise     Intervention Plan   Intervention Prescribe, educate and counsel regarding individualized specific dietary modifications aiming towards targeted core components such as weight, hypertension, lipid management, diabetes, heart failure and other comorbidities.;Nutrition handout(s) given to patient.   Expected Outcomes Short Term Goal: Understand basic principles of dietary content, such as calories, fat, sodium, cholesterol and nutrients.;Short Term Goal: A plan has been developed with personal nutrition goals set during dietitian appointment.;Long Term Goal: Adherence to prescribed nutrition plan.      Nutrition Discharge: Rate Your Plate Scores:      Nutrition Assessments - 04/04/16 1832      Rate Your Plate Scores   Post Score 61   Post Score % 67.7 %      Nutrition Goals Re-Evaluation:     Nutrition Goals Re-Evaluation    Row Name 02/22/16 8341 04/02/16 1708           Personal Goal #1 Re-Evaluation   Personal Goal #1 Work on portion control -- especially starches, meats -- eat generous portions of vegetables.  -      Goal Progress Seen Yes Yes      Comments Portions are smaller  Goal to continue to work on decreasing carbs and portions Was in  the hospital so a little harder and HBA1c is up but he is working on this.         Personal Goal #2 Re-Evaluation   Personal Goal #2 Try eating slower by drinking water during meals, taking small bites, and/or chewing thoroughly.  -      Goal Progress Seen No  -      Comments States is trying to work on this goal.  Drinking alot of diet soda and water in between.   Goal to continue to work on adding more water to daily meals  -        Personal Goal #3 Re-Evaluation   Personal Goal #3 Continue with regular exercise  -      Goal Progress Seen Yes  -      Comments Exercise  three days with program. Goal continue the exercise program  -         Psychosocial: Target Goals: Acknowledge presence or absence of depression, maximize coping skills, provide positive support system. Participant is able to verbalize types and ability to use techniques and skills needed for reducing stress and depression.  Initial Review & Psychosocial Screening:     Initial Psych Review & Screening - 01/16/16 1226      Family Dynamics   Good Support System? Yes     Screening Interventions   Interventions Encouraged to exercise      Quality of Life Scores:     Quality of Life - 04/04/16 1834      Quality of Life Scores   Health/Function Post 28 %   Socioeconomic Post 30 %   Psych/Spiritual Post 30 %   Family Post 30 %   GLOBAL Post 29.14 %      PHQ-9: Recent Review Flowsheet Data     Depression screen [facility] 2/9 04/04/2016 01/16/2016   Decreased Interest 0 0   Down, Depressed, Hopeless 0 0   PHQ - 2 Score 0 0   Altered sleeping 1 0   Tired, decreased energy 1 2   Change in appetite 0 2   Feeling bad or failure about yourself  1 0   Trouble concentrating 1 0   Moving slowly or fidgety/restless 0 1   Suicidal thoughts 0 0   PHQ-9 Score 4 5   Difficult doing work/chores Not difficult at all Not difficult at all      Psychosocial Evaluation and Intervention:     Psychosocial Evaluation - 01/23/16 1710      Psychosocial Evaluation & Interventions   Interventions Encouraged to exercise with the program and follow exercise prescription   Comments Counselor met with Mr. Chauncey Cruel today for initial psychosocial evaluation.  He will be 66 years old next week and had a stent inserted several weeks ago.  He is married and his wife's adult children live close by.  Mr. Chauncey Cruel has adult children in the [place] area.  His first wife passed in 2008.  Mr. Chauncey Cruel had a knee replacement in 2016 and he currently has diabetes as well as heart problems.  He reports sleeping "too much," although he is up and down urinating frequently in the night - he is in the bed for 12-14 hours typically.  Mr. Chauncey Cruel states he is going to check with the Dr. about this being a possible medication reaction to one of his heart meds.  His appetite has decreased over the past several years; although he continues to contend with being overweight.  He denies a history  or current symptoms of depression or anxiety.  He states his mood is typically positive and is even more positive when he isn't feeling so tired.  Mr. Chauncey Cruel reports he has minimal stress other than his health currently.  He has goals to increase his stamina and strength and have more energy while in this class.  Counselor encouraged him to check with his Dr. soon about the medications to see if this will improve his energy levels and decrease the amount of time he is in the bed each  day.  Staff will continue to follow with Mr. Chauncey Cruel throughout the course of this program.          Psychosocial Re-Evaluation:     Psychosocial Re-Evaluation    [place] Name 02/15/16 1650 04/02/16 1711           Psychosocial Re-Evaluation   Comments Counselor Follow up with Mr. Chauncey Cruel today to see if he was sleeping any better since beginning this class and he stated the "quality" of his sleep has improved.  He also reports he does not like to come to exercise but he notices how much better he feels immediately after the class and that inspires him to keep coming.  He is still wondering if one of his medications is causing tiredness, but he also thinks it can just be a combination of his heart and his diabetes.  He is working on portion control to help with weight loss and has met with the dietician.  Counselor commended him on his commitment to exercise and improved health.  He did sleep better last night and only woke up once last night. He is still sleeping alot but will try to exercise more.          Vocational Rehabilitation: Provide vocational rehab assistance to qualifying candidates.   Vocational Rehab Evaluation & Intervention:     Vocational Rehab - 01/16/16 1224      Initial Vocational Rehab Evaluation & Intervention   Assessment shows need for Vocational Rehabilitation No      Education: Education Goals: Education classes will be provided on a weekly basis, covering required topics. Participant will state understanding/return demonstration of topics presented.  Learning Barriers/Preferences:     Learning Barriers/Preferences - 01/16/16 1223      Learning Barriers/Preferences   Learning Barriers None   Learning Preferences Computer/Internet;Pictoral;Video      Education Topics: General Nutrition Guidelines/Fats and Fiber: -Group instruction provided by verbal, written material, models and posters to present the general guidelines for heart healthy nutrition. Gives an  explanation and review of dietary fats and fiber. Flowsheet Row Cardiac Rehab from 04/09/2016 in [facility] Cardiac and Pulmonary Rehab  Date  04/02/16  Educator  PI  Instruction Review Code  2- meets goals/outcomes      Controlling Sodium/Reading Food Labels: -Group verbal and written material supporting the discussion of sodium use in heart healthy nutrition. Review and explanation with models, verbal and written materials for utilization of the food label. Flowsheet Row Cardiac Rehab from 04/09/2016 in [facility] Cardiac and Pulmonary Rehab  Date  04/09/16  Educator  PI  Instruction Review Code  2- meets goals/outcomes      Exercise Physiology & Risk Factors: - Group verbal and written instruction with models to review the exercise physiology of the cardiovascular system and associated critical values. Details cardiovascular disease risk factors and the goals associated with each risk factor. Flowsheet Row Cardiac Rehab from 04/09/2016 in [facility] Cardiac and Pulmonary Rehab  Date  02/01/16  Educator  AS  Instruction Review Code  2- meets goals/outcomes      Aerobic Exercise & Resistance Training: - Gives group verbal and written discussion on the health impact of inactivity. On the components of aerobic and resistive training programs and the benefits of this training and how to safely progress through these programs. Flowsheet Row Cardiac Rehab from 04/09/2016 in [facility] Cardiac and Pulmonary Rehab  Date  02/20/16  Educator  K. Amedeo Plenty  Instruction Review Code  2- meets goals/outcomes      Flexibility, Balance, General Exercise Guidelines: - Provides group verbal and written instruction on the benefits of flexibility and balance training programs. Provides general exercise guidelines with specific guidelines to those with heart or lung disease. Demonstration and skill practice provided. Flowsheet Row Cardiac Rehab from 04/09/2016 in [facility] Cardiac and Pulmonary Rehab  Date  02/22/16  Educator  AS   Instruction Review Code  2- meets goals/outcomes      Stress Management: - Provides group verbal and written instruction about the health risks of elevated stress, cause of high stress, and healthy ways to reduce stress. Flowsheet Row Cardiac Rehab from 04/09/2016 in [facility] Cardiac and Pulmonary Rehab  Date  03/07/16  Educator  [facility]  Instruction Review Code  2- meets goals/outcomes      Depression: - Provides group verbal and written instruction on the correlation between heart/lung disease and depressed mood, treatment options, and the stigmas associated with seeking treatment. Flowsheet Row Cardiac Rehab from 04/09/2016 in [facility] Cardiac and Pulmonary Rehab  Date  04/04/16  Educator  Berle Mull, MSW2  Instruction Review Code  2- meets goals/outcomes      Anatomy & Physiology of the Heart: - Group verbal and written instruction and models provide basic cardiac anatomy and physiology, with the coronary electrical and arterial systems. Review of: AMI, Angina, Valve disease, Heart Failure, Cardiac Arrhythmia, Pacemakers, and the ICD. Flowsheet Row Cardiac Rehab from 04/09/2016 in [facility] [place] [facility] Cardiac and Pulmonary Rehab  Date  02/27/16  Educator  SB  Instruction Review Code  2- meets goals/outcomes      Cardiac Procedures: - Group verbal and written instruction and models to describe the testing methods done to diagnose heart disease. Reviews the outcomes of the test results. Describes the treatment choices: Medical Management, Angioplasty, or Coronary Bypass Surgery.   Cardiac Medications: - Group verbal and written instruction to review commonly prescribed medications for heart disease. Reviews the medication, class of the drug, and side effects. Includes the steps to properly store meds and maintain the prescription regimen. Flowsheet Row Cardiac Rehab from 04/09/2016 in [facility] Cardiac and Pulmonary Rehab  Date  01/23/16 [part1]  Educator  SB  Instruction Review Code  2- meets  goals/outcomes      Go Sex-Intimacy & Heart Disease, Get SMART - Goal Setting: - Group verbal and written instruction through game format to discuss heart disease and the return to sexual intimacy. Provides group verbal and written material to discuss and apply goal setting through the application of the S.M.A.R.T. Method.   Other Matters of the Heart: - Provides group verbal, written materials and models to describe Heart Failure, Angina, Valve Disease, and Diabetes in the realm of heart disease. Includes description of the disease process and treatment options available to the cardiac patient. Flowsheet Row Cardiac Rehab from 04/09/2016 in [facility] Cardiac and Pulmonary Rehab  Date  02/27/16  Educator  SB  Instruction Review Code  2- meets goals/outcomes      Exercise &  Equipment Safety: - Individual verbal instruction and demonstration of equipment use and safety with use of the equipment. Flowsheet Row Cardiac Rehab from 04/09/2016 in [facility] Cardiac and Pulmonary Rehab  Date  01/16/16  Educator  C. EnterkinRN  Instruction Review Code  2- meets goals/outcomes      Infection Prevention: - Provides verbal and written material to individual with discussion of infection control including proper hand washing and proper equipment cleaning during exercise session. Flowsheet Row Cardiac Rehab from 04/09/2016 in [facility] Cardiac and Pulmonary Rehab  Date  01/16/16  Educator  C. EnterkinRN  Instruction Review Code  2- meets goals/outcomes      Falls Prevention: - Provides verbal and written material to individual with discussion of falls prevention and safety. Flowsheet Row Cardiac Rehab from 04/09/2016 in [facility] Cardiac and Pulmonary Rehab  Date  01/16/16  Educator  C. [place]  Instruction Review Code  2- meets goals/outcomes      Diabetes: - Individual verbal and written instruction to review signs/symptoms of diabetes, desired ranges of glucose level fasting, after meals and with  exercise. Advice that pre and post exercise glucose checks will be done for 3 sessions at entry of program. [place] from 04/09/2016 in [facility] Cardiac and Pulmonary Rehab  Date  01/16/16  Educator  C. [place]  Instruction Review Code  2- meets goals/outcomes       Knowledge Questionnaire Score:     Knowledge Questionnaire Score - 04/04/16 1832      Knowledge Questionnaire Score   Post Score 24      Core Components/Risk Factors/Patient Goals at Admission:     Personal Goals and Risk Factors at Admission - 01/16/16 1225      Core Components/Risk Factors/Patient Goals on Admission    Weight Management Yes;Obesity;Weight Loss   Intervention Weight Management: Develop a combined nutrition and exercise program designed to reach desired caloric intake, while maintaining appropriate intake of nutrient and fiber, sodium and fats, and appropriate energy expenditure required for the weight goal.;Weight Management: Provide education and appropriate resources to help participant work on and attain dietary goals.;Weight Management/Obesity: Establish reasonable short term and long term weight goals.;Obesity: Provide education and appropriate resources to help participant work on and attain dietary goals.   Admit Weight 289 lb 3.2 oz (131.2 kg)   Goal Weight: Short Term 285 lb (129.3 kg)   Goal Weight: Long Term 220 lb (99.8 kg)   Expected Outcomes Short Term: Continue to assess and modify interventions until short term weight is achieved;Long Term: Adherence to nutrition and physical activity/exercise program aimed toward attainment of established weight goal;Weight Loss: Understanding of general recommendations for a balanced deficit meal plan, which promotes 1-2 lb weight loss per week and includes a negative energy balance of [phone] kcal/d;Understanding recommendations for meals to include 15-35% energy as protein, 25-35% energy from fat, 35-60% energy from carbohydrates, less  than '200mg'$  of dietary cholesterol, 20-35 gm of total fiber daily;Understanding of distribution of calorie intake throughout the day with the consumption of 4-5 meals/snacks   Sedentary Yes   Intervention Provide advice, education, support and counseling about physical activity/exercise needs.;Develop an individualized exercise prescription for aerobic and resistive training based on initial evaluation findings, risk stratification, comorbidities and participant's personal goals.   Expected Outcomes Achievement of increased cardiorespiratory fitness and enhanced flexibility, muscular endurance and strength shown through measurements of functional capacity and personal statement of participant.   Increase Strength and Stamina Yes   Intervention Provide advice, education, support  and counseling about physical activity/exercise needs.;Develop an individualized exercise prescription for aerobic and resistive training based on initial evaluation findings, risk stratification, comorbidities and participant's personal goals.   Expected Outcomes Achievement of increased cardiorespiratory fitness and enhanced flexibility, muscular endurance and strength shown through measurements of functional capacity and personal statement of participant.   Diabetes Yes   Intervention Provide education about signs/symptoms and action to take for hypo/hyperglycemia.;Provide education about proper nutrition, including hydration, and aerobic/resistive exercise prescription along with prescribed medications to achieve blood glucose in normal ranges: Fasting glucose 65-99 mg/dL   Expected Outcomes Short Term: Participant verbalizes understanding of the signs/symptoms and immediate care of hyper/hypoglycemia, proper foot care and importance of medication, aerobic/resistive exercise and nutrition plan for blood glucose control.;Long Term: Attainment of HbA1C < 7%.   Heart Failure Yes   Intervention Provide a combined exercise and  nutrition program that is supplemented with education, support and counseling about heart failure. Directed toward relieving symptoms such as shortness of breath, decreased exercise tolerance, and extremity edema.   Expected Outcomes Improve functional capacity of life;Short term: Attendance in program 2-3 days a week with increased exercise capacity. Reported lower sodium intake. Reported increased fruit and vegetable intake. Reports medication compliance.;Short term: Daily weights obtained and reported for increase. Utilizing diuretic protocols set by physician.;Long term: Adoption of self-care skills and reduction of barriers for early signs and symptoms recognition and intervention leading to self-care maintenance.   Hypertension Yes   Intervention Provide education on lifestyle modifcations including regular physical activity/exercise, weight management, moderate sodium restriction and increased consumption of fresh fruit, vegetables, and low fat dairy, alcohol moderation, and smoking cessation.;Monitor prescription use compliance.   Expected Outcomes Short Term: Continued assessment and intervention until BP is < 140/31m HG in hypertensive participants. < 130/849mHG in hypertensive participants with diabetes, heart failure or chronic kidney disease.;Long Term: Maintenance of blood pressure at goal levels.   Lipids Yes   Intervention Provide education and support for participant on nutrition & aerobic/resistive exercise along with prescribed medications to achieve LDL '70mg'$ , HDL >'40mg'$ .   Expected Outcomes Short Term: Participant states understanding of desired cholesterol values and is compliant with medications prescribed. Participant is following exercise prescription and nutrition guidelines.;Long Term: Cholesterol controlled with medications as prescribed, with individualized exercise RX and with personalized nutrition plan. Value goals: LDL < '70mg'$ , HDL > 40 mg.      Core Components/Risk  Factors/Patient Goals Review:      Goals and Risk Factor Review    Row Name 01/27/16 1416 02/22/16 1715 02/22/16 1719 04/02/16 1709       Core Components/Risk Factors/Patient Goals Review   Personal Goals Review Weight Management/Obesity;Increase Strength and Stamina;Lipids;Hypertension;Diabetes Weight Management/Obesity;Hypertension;Lipids;Heart Failure;Diabetes  - Weight Management/Obesity    Review Joes weight is currently 286.  he has met with the dietician and confirmed his knowledge of ways to manage his DM.  He is also watching his food intake.    He is exercising 3 days per week and reports feeling better after exercise.  HTN and cholesterol are the same and he is taking meds as directed.   - Durenda Hurttates his weight is running up and down, though hehas maintained 3 pounds down. He is working on his nutrition goals and is attending class for the exercise.  He stated that his energy levels do flucuate, and he thinks he has more stamina than he did when he started,. He feels the exercise has been helful. He continues to maintain BP, chol  diabetes in good range with the medications, nutrition and exercise. Was in the hospital so a little harder and HBA1c is up but he is working on this. Chip will see Cardiologist in the am again to evaluate his holter monitor.     Expected Outcomes Joe will continue to praactice healthier habits with diet and exercise and see an overall improvement in his health.   - Joe will continue to practice healthier habits with diet and exercise and see an overall improvement in his health.  Heart healthy life       Core Components/Risk Factors/Patient Goals at Discharge (Final Review):      Goals and Risk Factor Review - 04/02/16 1709      Core Components/Risk Factors/Patient Goals Review   Personal Goals Review Weight Management/Obesity   Review Was in the hospital so a little harder and HBA1c is up but he is working on this. Sir will see Cardiologist in the  am again to evaluate his holter monitor.    Expected Outcomes Heart healthy life      ITP Comments:     ITP Comments    Row Name 01/16/16 1224 01/16/16 1258 02/01/16 0646 02/29/16 0640 02/29/16 1731   ITP Comments Broadus John said he knows what to eat but he loves carbs like bread. He doesn't really like vegetables. ITP created during Cardiac Medical Review appt after the Cardiac Rehab informed consent was signed.  30 day review completed for Medical Director physician review and signature. Continue ITP unless changes made by physician. 30 day review completed for review by Dr Emily Filbert.  Continue with ITP unless changes noted by Dr Sabra Heck. Heart rate 150 upon arrival to Cardiac REhab. Walked slow and heart rate never below 130 blood pressure was stable at 142/80. Taken to [employer] via wheelchair since after 3 glasses of 240 cc of water his heart rate was still 130. Estaban admits he was up alot last night since his blood sugar was up.    [place] Name 03/05/16 1130 03/09/16 1254 03/23/16 1449 03/27/16 0650 04/02/16 1710   ITP Comments I just called to check on Randee and he said he did get admitted to the hospital after I took him to the [facility] with heart rate 130-150. He is still on oxgyen which was started in the hospital so I instructed him on pursed lip breathing over the phone. Koston said he will not be in Cardiac Rehab this afternoon but hopes to be back on Wed. Eyal has not been cleared to return to exercise yet by Dr Nehemiah Massed.  He expects to be back in the next 2 weeks. Joe had to wear a 24/48hr holter.  He was found to be in afib.  He has another follow up on Jan 9.  Hopefully will be able to return after that. 30 day review. Continue with ITP unless directed changes per Medical Director review. I encouraged Attikus when he felt like he wanted a nap to walk for 30 minutes. His wife goes to the gym 3 times a week but he doesn't feel he can go so will try to walk at home or use his home  exercise equipment. He is being worked up for [facility]      Comments: Discharged

## 2016-04-09 NOTE — Patient Instructions (Signed)
Discharge Instructions  Patient Details  Name: Kenneth Hahn MRN: [id number] Date of Birth: May 13, 1950 Referring Provider:  Marcina Millard, MD   Number of Visits: 45  Reason for Discharge:  Patient reached a stable level of exercise. Patient independent in their exercise.  Smoking History:  History  Smoking Status  . Former Smoker  . Quit date: 03/26/1973  Smokeless Tobacco  . Never Used    Diagnosis:  Status post coronary artery stent placement  Initial Exercise Prescription:     Initial Exercise Prescription - 01/16/16 1400      Date of Initial Exercise RX and Referring Provider   Date 01/16/16   Referring Provider Arnoldo Hooker MD     Treadmill   MPH 2   Grade 0   Minutes 15   METs 2.53     NuStep   Level 1   Minutes 15   METs 2     Biostep-RELP   Level 1   Minutes 15   METs 2     Prescription Details   Frequency (times per week) 3   Duration Progress to 45 minutes of aerobic exercise without signs/symptoms of physical distress     Intensity   THRR 40-80% of Max Heartrate 107-138   Ratings of Perceived Exertion 11-15   Perceived Dyspnea 0-4     Progression   Progression Continue to progress workloads to maintain intensity without signs/symptoms of physical distress.     Resistance Training   Training Prescription Yes   Weight 3 lbs   Reps 10-12      Discharge Exercise Prescription (Final Exercise Prescription Changes):     Exercise Prescription Changes - 04/05/16 1100      Response to Exercise   Blood Pressure (Admit) 124/80   Blood Pressure (Exercise) 136/64   Blood Pressure (Exit) 128/70   Heart Rate (Admit) 99 bpm   Heart Rate (Exercise) 126 bpm   Heart Rate (Exit) 96 bpm   Rating of Perceived Exertion (Exercise) 12   Symptoms none   Duration Progress to 45 minutes of aerobic exercise without signs/symptoms of physical distress   Intensity THRR unchanged     Progression   Progression Continue to progress  workloads to maintain intensity without signs/symptoms of physical distress.   Average METs 2.81     Resistance Training   Training Prescription Yes   Weight 4   Reps 10-12     Treadmill   MPH 2   Grade 1   Minutes 15   METs 2.81      Functional Capacity:     6 Minute Walk    Row Name 01/16/16 1405 04/05/16 1133       6 Minute Walk   Phase Initial Discharge    Distance 1170 feet 1235 feet    Walk Time 6 minutes 6 minutes    # of Rest Breaks 0 0    MPH 2.22 2.34    METS 2.68 2.76    RPE 13 13    Perceived Dyspnea  4  -    VO2 Peak 7.82 8.5    Symptoms Yes (comment) Yes (comment)    Comments SOB, chronic hip pain (2/10), chronic right ankle pain (2/10) knees hurt    Resting HR 75 bpm 99 bpm    Resting BP 116/64 124/80    Max Ex. HR 109 bpm 114 bpm    Max Ex. BP 146/74 136/64    2 Minute Post BP 136/74  -  Quality of Life:     Quality of Life - 04/04/16 1834      Quality of Life Scores   Health/Function Post 28 %   Socioeconomic Post 30 %   Psych/Spiritual Post 30 %   Family Post 30 %   GLOBAL Post 29.14 %      Personal Goals: Goals established at orientation with interventions provided to work toward goal.     Personal Goals and Risk Factors at Admission - 01/16/16 1225      Core Components/Risk Factors/Patient Goals on Admission    Weight Management Yes;Obesity;Weight Loss   Intervention Weight Management: Develop a combined nutrition and exercise program designed to reach desired caloric intake, while maintaining appropriate intake of nutrient and fiber, sodium and fats, and appropriate energy expenditure required for the weight goal.;Weight Management: Provide education and appropriate resources to help participant work on and attain dietary goals.;Weight Management/Obesity: Establish reasonable short term and long term weight goals.;Obesity: Provide education and appropriate resources to help participant work on and attain dietary goals.    Admit Weight 289 lb 3.2 oz (131.2 kg)   Goal Weight: Short Term 285 lb (129.3 kg)   Goal Weight: Long Term 220 lb (99.8 kg)   Expected Outcomes Short Term: Continue to assess and modify interventions until short term weight is achieved;Long Term: Adherence to nutrition and physical activity/exercise program aimed toward attainment of established weight goal;Weight Loss: Understanding of general recommendations for a balanced deficit meal plan, which promotes 1-2 lb weight loss per week and includes a negative energy balance of [phone] kcal/d;Understanding recommendations for meals to include 15-35% energy as protein, 25-35% energy from fat, 35-60% energy from carbohydrates, less than 200mg  of dietary cholesterol, 20-35 gm of total fiber daily;Understanding of distribution of calorie intake throughout the day with the consumption of 4-5 meals/snacks   Sedentary Yes   Intervention Provide advice, education, support and counseling about physical activity/exercise needs.;Develop an individualized exercise prescription for aerobic and resistive training based on initial evaluation findings, risk stratification, comorbidities and participant's personal goals.   Expected Outcomes Achievement of increased cardiorespiratory fitness and enhanced flexibility, muscular endurance and strength shown through measurements of functional capacity and personal statement of participant.   Increase Strength and Stamina Yes   Intervention Provide advice, education, support and counseling about physical activity/exercise needs.;Develop an individualized exercise prescription for aerobic and resistive training based on initial evaluation findings, risk stratification, comorbidities and participant's personal goals.   Expected Outcomes Achievement of increased cardiorespiratory fitness and enhanced flexibility, muscular endurance and strength shown through measurements of functional capacity and personal statement of participant.    Diabetes Yes   Intervention Provide education about signs/symptoms and action to take for hypo/hyperglycemia.;Provide education about proper nutrition, including hydration, and aerobic/resistive exercise prescription along with prescribed medications to achieve blood glucose in normal ranges: Fasting glucose 65-99 mg/dL   Expected Outcomes Short Term: Participant verbalizes understanding of the signs/symptoms and immediate care of hyper/hypoglycemia, proper foot care and importance of medication, aerobic/resistive exercise and nutrition plan for blood glucose control.;Long Term: Attainment of HbA1C < 7%.   Heart Failure Yes   Intervention Provide a combined exercise and nutrition program that is supplemented with education, support and counseling about heart failure. Directed toward relieving symptoms such as shortness of breath, decreased exercise tolerance, and extremity edema.   Expected Outcomes Improve functional capacity of life;Short term: Attendance in program 2-3 days a week with increased exercise capacity. Reported lower sodium intake. Reported increased fruit and vegetable  intake. Reports medication compliance.;Short term: Daily weights obtained and reported for increase. Utilizing diuretic protocols set by physician.;Long term: Adoption of self-care skills and reduction of barriers for early signs and symptoms recognition and intervention leading to self-care maintenance.   Hypertension Yes   Intervention Provide education on lifestyle modifcations including regular physical activity/exercise, weight management, moderate sodium restriction and increased consumption of fresh fruit, vegetables, and low fat dairy, alcohol moderation, and smoking cessation.;Monitor prescription use compliance.   Expected Outcomes Short Term: Continued assessment and intervention until BP is < 140/1790mm HG in hypertensive participants. < 130/1480mm HG in hypertensive participants with diabetes, heart failure or  chronic kidney disease.;Long Term: Maintenance of blood pressure at goal levels.   Lipids Yes   Intervention Provide education and support for participant on nutrition & aerobic/resistive exercise along with prescribed medications to achieve LDL 70mg , HDL >40mg .   Expected Outcomes Short Term: Participant states understanding of desired cholesterol values and is compliant with medications prescribed. Participant is following exercise prescription and nutrition guidelines.;Long Term: Cholesterol controlled with medications as prescribed, with individualized exercise RX and with personalized nutrition plan. Value goals: LDL < 70mg , HDL > 40 mg.       Personal Goals Discharge:     Goals and Risk Factor Review - 04/02/16 1709      Core Components/Risk Factors/Patient Goals Review   Personal Goals Review Weight Management/Obesity   Review Was in the hospital so a little harder and HBA1c is up but he is working on this. Kenneth Hahn will see Cardiologist in the am again to evaluate his holter monitor.    Expected Outcomes Heart healthy life      Nutrition & Weight - Outcomes:     Pre Biometrics - 01/16/16 1412      Pre Biometrics   Height 5' 9.9" (1.775 m)   Weight 289 lb 3.2 oz (131.2 kg)   Waist Circumference 49 inches   Hip Circumference 48.75 inches   Waist to Hip Ratio 1.01 %   BMI (Calculated) 41.7   Single Leg Stand 1.17 seconds         Post Biometrics - 04/06/16 1022       Post  Biometrics   Height 5' 9.9" (1.775 m)   Waist Circumference 49 inches   Hip Circumference 49 inches   Waist to Hip Ratio 1 %   Single Leg Stand 3.1 seconds      Nutrition:     Nutrition Therapy & Goals - 01/20/16 1451      Nutrition Therapy   Diet 1700kcal diabetes diet with DASH diet guidelines   Drug/Food Interactions Statins/Certain Fruits;Coumadin/Vit K   Protein (specify units) 8oz   Fiber 30 grams   Whole Grain Foods 3 servings   Saturated Fats 14 max. grams   Fruits and  Vegetables 5 servings/day   Sodium 1500 grams     Personal Nutrition Goals   Personal Goal #1 Work on portion control -- especially starches, meats -- eat generous portions of vegetables.   Personal Goal #2 Try eating slower by drinking water during meals, taking small bites, and/or chewing thoroughly.   Personal Goal #3 Continue with regular exercise     Intervention Plan   Intervention Prescribe, educate and counsel regarding individualized specific dietary modifications aiming towards targeted core components such as weight, hypertension, lipid management, diabetes, heart failure and other comorbidities.;Nutrition handout(s) given to patient.   Expected Outcomes Short Term Goal: Understand basic principles of dietary content, such as calories, fat,  sodium, cholesterol and nutrients.;Short Term Goal: A plan has been developed with personal nutrition goals set during dietitian appointment.;Long Term Goal: Adherence to prescribed nutrition plan.      Nutrition Discharge:     Nutrition Assessments - 04/04/16 1832      Rate Your Plate Scores   Post Score 61   Post Score % 67.7 %      Education Questionnaire Score:     Knowledge Questionnaire Score - 04/04/16 1832      Knowledge Questionnaire Score   Post Score 24      Goals reviewed with patient; copy given to patient.

## 2016-04-09 NOTE — Progress Notes (Signed)
Daily Session Note  Patient Details  Name: Keshon Markovitz MRN: [id number] Date of Birth: [birth year] Referring Provider:   Flowsheet Row Cardiac Rehab from 01/16/2016 in [facility]  Referring Provider  Serafina Royals MD      Encounter Date: 04/09/2016  Check In:     Session Check In - 04/09/16 1621      Check-In   Location [facility]-Cardiac & Pulmonary Rehab   Staff Present Gerlene Burdock, RN, BSN;Susanne Bice, RN, BSN, Laveda Norman, BS, ACSM CEP, Exercise Physiologist   Supervising physician immediately available to respond to emergencies See telemetry face sheet for immediately available ER MD   Medication changes reported     No   Fall or balance concerns reported    No   Warm-up and Cool-down Performed on first and last piece of equipment   Resistance Training Performed Yes   VAD Patient? No     Pain Assessment   Currently in Pain? No/denies   Multiple Pain Sites No         Goals Met:  Independence with exercise equipment Exercise tolerated well Personal goals reviewed No report of cardiac concerns or symptoms Strength training completed today  Goals Unmet:  Not Applicable  Comments:  Jeremiyah graduated today from cardiac rehab with 36 sessions completed.  Details of the patient's exercise prescription and what He needs to do in order to continue the prescription and progress were discussed with patient.  Patient was given a copy of prescription and goals.  Patient verbalized understanding.  Broadus John plans to continue to exercise by walking at home and using his eliptical.    Dr. Emily Filbert is Medical Director for [facility].

## 2017-11-12 ENCOUNTER — Ambulatory Visit: Payer: Medicare Other

## 2017-11-12 ENCOUNTER — Ambulatory Visit
Admission: EM | Admit: 2017-11-12 | Discharge: 2017-11-12 | Disposition: A | Discharge status: Home / Self Care | Payer: Medicare Other | Attending: Family Medicine | Admitting: Family Medicine

## 2017-11-12 ENCOUNTER — Encounter: Payer: Self-pay | Admitting: Emergency Medicine

## 2017-11-12 ENCOUNTER — Other Ambulatory Visit: Payer: Self-pay

## 2017-11-12 DIAGNOSIS — Z794 Long term (current) use of insulin: Secondary | ICD-10-CM | POA: Insufficient documentation

## 2017-11-12 DIAGNOSIS — I4891 Unspecified atrial fibrillation: Secondary | ICD-10-CM | POA: Diagnosis not present

## 2017-11-12 DIAGNOSIS — W010XXA Fall on same level from slipping, tripping and stumbling without subsequent striking against object, initial encounter: Secondary | ICD-10-CM | POA: Diagnosis not present

## 2017-11-12 DIAGNOSIS — Z955 Presence of coronary angioplasty implant and graft: Secondary | ICD-10-CM | POA: Diagnosis not present

## 2017-11-12 DIAGNOSIS — Z7901 Long term (current) use of anticoagulants: Secondary | ICD-10-CM | POA: Insufficient documentation

## 2017-11-12 DIAGNOSIS — E785 Hyperlipidemia, unspecified: Secondary | ICD-10-CM | POA: Diagnosis not present

## 2017-11-12 DIAGNOSIS — Z833 Family history of diabetes mellitus: Secondary | ICD-10-CM | POA: Insufficient documentation

## 2017-11-12 DIAGNOSIS — M1711 Unilateral primary osteoarthritis, right knee: Secondary | ICD-10-CM | POA: Diagnosis not present

## 2017-11-12 DIAGNOSIS — Z96652 Presence of left artificial knee joint: Secondary | ICD-10-CM | POA: Diagnosis not present

## 2017-11-12 DIAGNOSIS — Z8249 Family history of ischemic heart disease and other diseases of the circulatory system: Secondary | ICD-10-CM | POA: Diagnosis not present

## 2017-11-12 DIAGNOSIS — Z87891 Personal history of nicotine dependence: Secondary | ICD-10-CM | POA: Diagnosis not present

## 2017-11-12 DIAGNOSIS — Z807 Family history of other malignant neoplasms of lymphoid, hematopoietic and related tissues: Secondary | ICD-10-CM | POA: Diagnosis not present

## 2017-11-12 DIAGNOSIS — I10 Essential (primary) hypertension: Secondary | ICD-10-CM | POA: Diagnosis not present

## 2017-11-12 DIAGNOSIS — S8001XA Contusion of right knee, initial encounter: Secondary | ICD-10-CM

## 2017-11-12 DIAGNOSIS — Z86010 Personal history of colonic polyps: Secondary | ICD-10-CM | POA: Insufficient documentation

## 2017-11-12 DIAGNOSIS — Z79899 Other long term (current) drug therapy: Secondary | ICD-10-CM | POA: Diagnosis not present

## 2017-11-12 DIAGNOSIS — Z7982 Long term (current) use of aspirin: Secondary | ICD-10-CM | POA: Insufficient documentation

## 2017-11-12 DIAGNOSIS — Z7902 Long term (current) use of antithrombotics/antiplatelets: Secondary | ICD-10-CM | POA: Insufficient documentation

## 2017-11-12 DIAGNOSIS — M25561 Pain in right knee: Secondary | ICD-10-CM | POA: Diagnosis present

## 2017-11-12 DIAGNOSIS — I2510 Atherosclerotic heart disease of native coronary artery without angina pectoris: Secondary | ICD-10-CM | POA: Insufficient documentation

## 2017-11-12 DIAGNOSIS — E119 Type 2 diabetes mellitus without complications: Secondary | ICD-10-CM | POA: Insufficient documentation

## 2017-11-12 NOTE — ED Provider Notes (Signed)
[facility]    CSN: [id number] Arrival date & time: 11/12/17  1200     History   Chief Complaint Chief Complaint  Patient presents with  . Knee Pain    HPI Kenneth HumphreysJoseph Attila Hahn is a 67 y.o. male.   HPI  67 year old male accompanied by his wife stating that last night he tripped and fell landing on his right knee , his right shoulder and an abrasion on his forehead.  States he did not have syncopal or near syncopal episode.  He thinks he tripped.  Fell on a hardwood floor.  He thinks he took his whole weight on his right knee.  He has a left TKA and was told that the right knee will need to be resurfaced as well.  He is on warfarin and aspirin due to atrial fibrillation.  He has an appointment with an orthopedic surgery at [facility] on Monday.  His last INR was recorded at 2.8         Past Medical History:  Diagnosis Date  . Atrial fibrillation (HCC)   . Colon polyps   . Diabetes (HCC)   . Hyperlipemia   . Hypertension     Patient Active Problem List   Diagnosis Date Noted  . A-fib (HCC) 03/01/2016  . Atrial fibrillation with rapid ventricular response (HCC) 02/29/2016  . CAD (coronary artery disease) 01/04/2016  . Unstable angina (HCC) 12/27/2015  . Atrial fibrillation (HCC) 06/02/2012  . HTN (hypertension) 06/02/2012  . DM (diabetes mellitus) (HCC) 06/02/2012  . Hx of colonic polyps 06/02/2012  . Chronic anticoagulation 06/02/2012    Past Surgical History:  Procedure Laterality Date  . CARDIAC CATHETERIZATION Left 01/04/2016   Procedure: Left Heart Cath and Coronary Angiography;  Surgeon: Lamar BlinksBruce J Kowalski, MD;  Location: [facility] INVASIVE CV LAB;  Service: Cardiovascular;  Laterality: Left;  . CARDIAC CATHETERIZATION N/A 01/04/2016   Procedure: Coronary Stent Intervention;  Surgeon: Marcina MillardAlexander Paraschos, MD;  Location: [facility] INVASIVE CV LAB;  Service: Cardiovascular;  Laterality: N/A;  . CARPAL TUNNEL RELEASE     right hand  . ROTATOR CUFF REPAIR     right shoulder  . UMBILICAL HERNIA REPAIR         Home Medications    Prior to Admission medications   Medication Sig Start Date End Date Taking? Authorizing Provider  allopurinol (ZYLOPRIM) 100 MG tablet Take 100 mg by mouth daily. 10/23/17  Yes [provider]  amLODipine (NORVASC) 5 MG tablet Take by mouth. 12/10/13  Yes [provider]  aspirin EC 81 MG tablet Take by mouth.   Yes [provider]  atenolol (TENORMIN) 25 MG tablet Take 25 mg by mouth 2 (two) times daily. Take one tablet morning and evening   Yes [provider]  benazepril (LOTENSIN) 20 MG tablet Take 20 mg by mouth daily.   Yes [provider]  clopidogrel (PLAVIX) 75 MG tablet Take 1 tablet (75 mg total) by mouth daily with breakfast. 01/05/16  Yes Lamar BlinksKowalski, Bruce J, MD  diltiazem (CARDIZEM CD) 240 MG 24 hr capsule Take 1 capsule (240 mg total) by mouth daily. 03/02/16  Yes [name], Qing, MD  furosemide (LASIX) 20 MG tablet Take by mouth. 01/01/17 01/01/18 Yes [provider]  gabapentin (NEURONTIN) 100 MG capsule Take by mouth. 02/20/17 02/20/18 Yes [provider]  glimepiride (AMARYL) 2 MG tablet Take 2 mg by mouth daily. 09/19/17  Yes [provider]  insulin glargine (LANTUS) 100 UNIT/ML injection Inject 0.62 mLs (62  Units total) into the skin at bedtime. Patient taking differently: Inject 52 Units into the skin at bedtime.  01/04/16  Yes Lamar Blinks, MD  metoprolol succinate (TOPROL-XL) 50 MG 24 hr tablet Take by mouth. 05/29/17 05/29/18 Yes [provider]  Multiple Vitamin (MULTIVITAMIN) tablet Take 1 tablet by mouth daily.   Yes [provider]  Multiple Vitamins-Minerals (PRESERVISION AREDS 2+MULTI VIT) CAPS Take 1 capsule by mouth 2 (two) times daily.   Yes [provider]  tamsulosin (FLOMAX) 0.4 MG CAPS capsule Take 0.4 mg by mouth daily. 09/02/17  Yes [provider]  warfarin (COUMADIN) 3 MG tablet Take  1-3 mg by mouth one time only at 6 PM. Take 3mg  every day, except Thursday, take 4.5mg    Yes [provider]    Family History Family History  Adopted: Yes  Problem Relation Age of Onset  . Diabetes Father   . Heart disease Father   . Squamous cell carcinoma Sister   . Lymphoma Mother     Social History Social History   Tobacco Use  . Smoking status: Former Smoker    Last attempt to quit: 03/26/1973    Years since quitting: 44.6  . Smokeless tobacco: Never Used  Substance Use Topics  . Alcohol use: Yes    Comment: 2 drinks a day  . Drug use: No     Allergies   Metformin   Review of Systems Review of Systems  Constitutional: Positive for activity change. Negative for appetite change, chills, fatigue and fever.  Musculoskeletal: Positive for gait problem and joint swelling.  Skin: Positive for wound.  All other systems reviewed and are negative.    Physical Exam Triage Vital Signs ED Triage Vitals  Enc Vitals Group     BP 11/12/17 1217 (!) 152/64     Pulse Rate 11/12/17 1217 80     Resp 11/12/17 1217 18     Temp 11/12/17 1217 98.7 F (37.1 C)     Temp Source 11/12/17 1217 Oral     SpO2 11/12/17 1217 98 %     Weight 11/12/17 1211 270 lb (122.5 kg)     Height 11/12/17 1211 5\' 10"  (1.778 m)     Head Circumference --      Peak Flow --      Pain Score 11/12/17 1211 9     Pain Loc --      Pain Edu? --      Excl. in GC? --    No data found.  Updated Vital Signs BP (!) 152/64 (BP Location: Right Arm)   Pulse 80   Temp 98.7 F (37.1 C) (Oral)   Resp 18   Ht 5\' 10"  (1.778 m)   Wt 270 lb (122.5 kg)   SpO2 98%   BMI 38.74 kg/m   Visual Acuity Right Eye Distance:   Left Eye Distance:   Bilateral Distance:    Right Eye Near:   Left Eye Near:    Bilateral Near:     Physical Exam  Constitutional: He is oriented to person, place, and time. He appears well-developed and well-nourished. No distress.  HENT:  Head: Normocephalic.  Eyes: Pupils  are equal, round, and reactive to light. Right eye exhibits no discharge. Left eye exhibits no discharge.  Neck: Normal range of motion. Neck supple.  Musculoskeletal: He exhibits edema and tenderness.  Exam of the right knee shows no abrasion or ecchymosis present.  There is mild amount of effusion mostly suprapatellar.  Patient is tender all about the patella.  No joint line tenderness either medial or lateral.  The lateral collateral ligament is intact the medial collateral ligament is slightly lax but has a definite endpoint.  He has a negative anterior drawer sign.  Good quadriceps strength. Able To fully extend and is able to flex to 100 degrees  Neurological: He is alert and oriented to person, place, and time.  Skin: Skin is warm and dry. He is not diaphoretic.  Psychiatric: He has a normal mood and affect. His behavior is normal. Judgment and thought content normal.  Nursing note and vitals reviewed.    UC Treatments / Results  Labs (all labs ordered are listed, but only abnormal results are displayed) Labs Reviewed - No data to display  EKG None  Radiology Dg Knee Complete 4 Views Right  Result Date: 11/12/2017 CLINICAL DATA:  Status post fall.  Pain. EXAM: RIGHT KNEE - COMPLETE 4+ VIEW COMPARISON:  None. FINDINGS: No acute fracture or dislocation. Severe medial femorotibial compartment joint space narrowing. Mild patellofemoral compartment joint space narrowing with small marginal osteophytes. Lateral femorotibial compartment marginal osteophytes without significant joint space narrowing. No significant joint effusion. Peripheral vascular atherosclerotic disease. IMPRESSION: 1.  No acute osseous injury of the right knee. 2. Severe right medial femorotibial compartment osteoarthritis. Electronically Signed   By: Elige KoHetal  Patel   On: 11/12/2017 12:53    Procedures Procedures (including critical care time)  Medications Ordered in UC Medications - No data to display  Initial  Impression / Assessment and Plan / UC Course  I have reviewed the triage vital signs and the nursing notes.  Pertinent labs & imaging results that were available during my care of the patient were reviewed by me and considered in my medical decision making (see chart for details).     Plan: 1. Test/x-ray results and diagnosis reviewed with patient 2. rx as per orders; risks, benefits, potential side effects reviewed with patient 3. Recommend supportive treatment with ice 20 minutes every 2 hours 4-5 times daily.  Crutch or cane to assist with ambulation.  She did not did not wish to have a knee immobilizer today.  Encouraged him to follow-up with his orthopedist on Monday.  Because of the anticoagulation is currently on would not add any NSAIDs but the patient could use Tylenol. 4. F/u prn if symptoms worsen or don't improve  Final Clinical Impressions(s) / UC Diagnoses   Final diagnoses:  Contusion of right knee, initial encounter  Primary osteoarthritis of right knee     Discharge Instructions     Apply ice 20 minutes out of every 2 hours 4-5 times daily for comfort.  Elevate above the level heart sufficiently to control swelling and pain.  Use crutch or cane to assist in ambulation.  Follow-up with your orthopedic surgeon on Monday.    ED Prescriptions    None     Controlled Substance Prescriptions [place] Controlled Substance Registry consulted? Not Applicable   Lutricia FeilRoemer, Griffyn Kucinski P, PA-C 11/12/17 1423

## 2017-11-12 NOTE — Discharge Instructions (Signed)
Apply ice 20 minutes out of every 2 hours 4-5 times daily for comfort.  Elevate above the level heart sufficiently to control swelling and pain.  Use crutch or cane to assist in ambulation.  Follow-up with your orthopedic surgeon on Monday.

## 2017-11-12 NOTE — ED Triage Notes (Signed)
Patient c/o fall last night hitting his right knee. Patient now c/o right knee pain and swelling.

## 2017-11-18 IMAGING — CR DG CHEST 2V
2 series · 2 of 2 positions shown · non-contrast
Comparison: None.

CLINICAL DATA: Chest pain.  Irregular heart rate.

EXAM:
CHEST  2 VIEW

[chest pa]
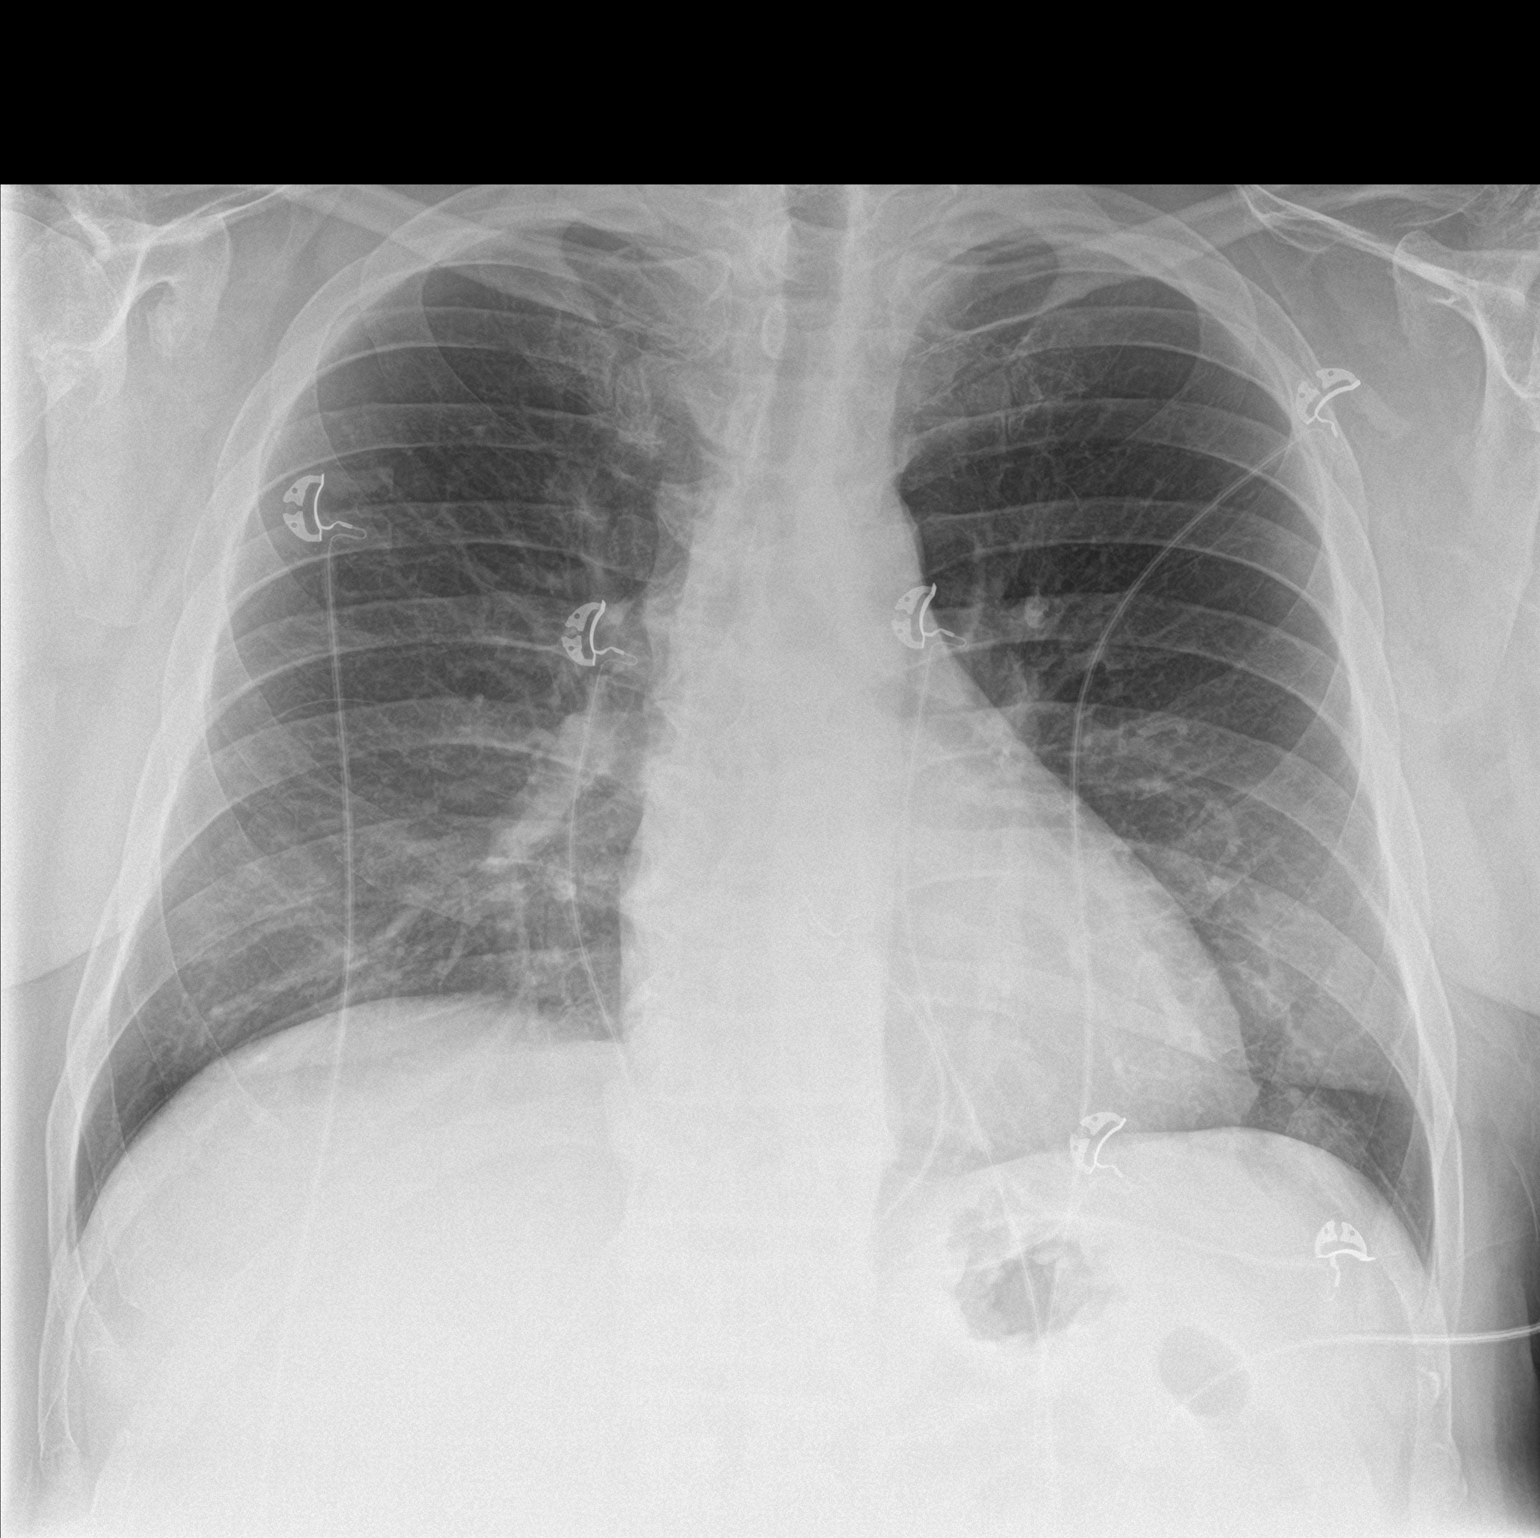

[chest lat]
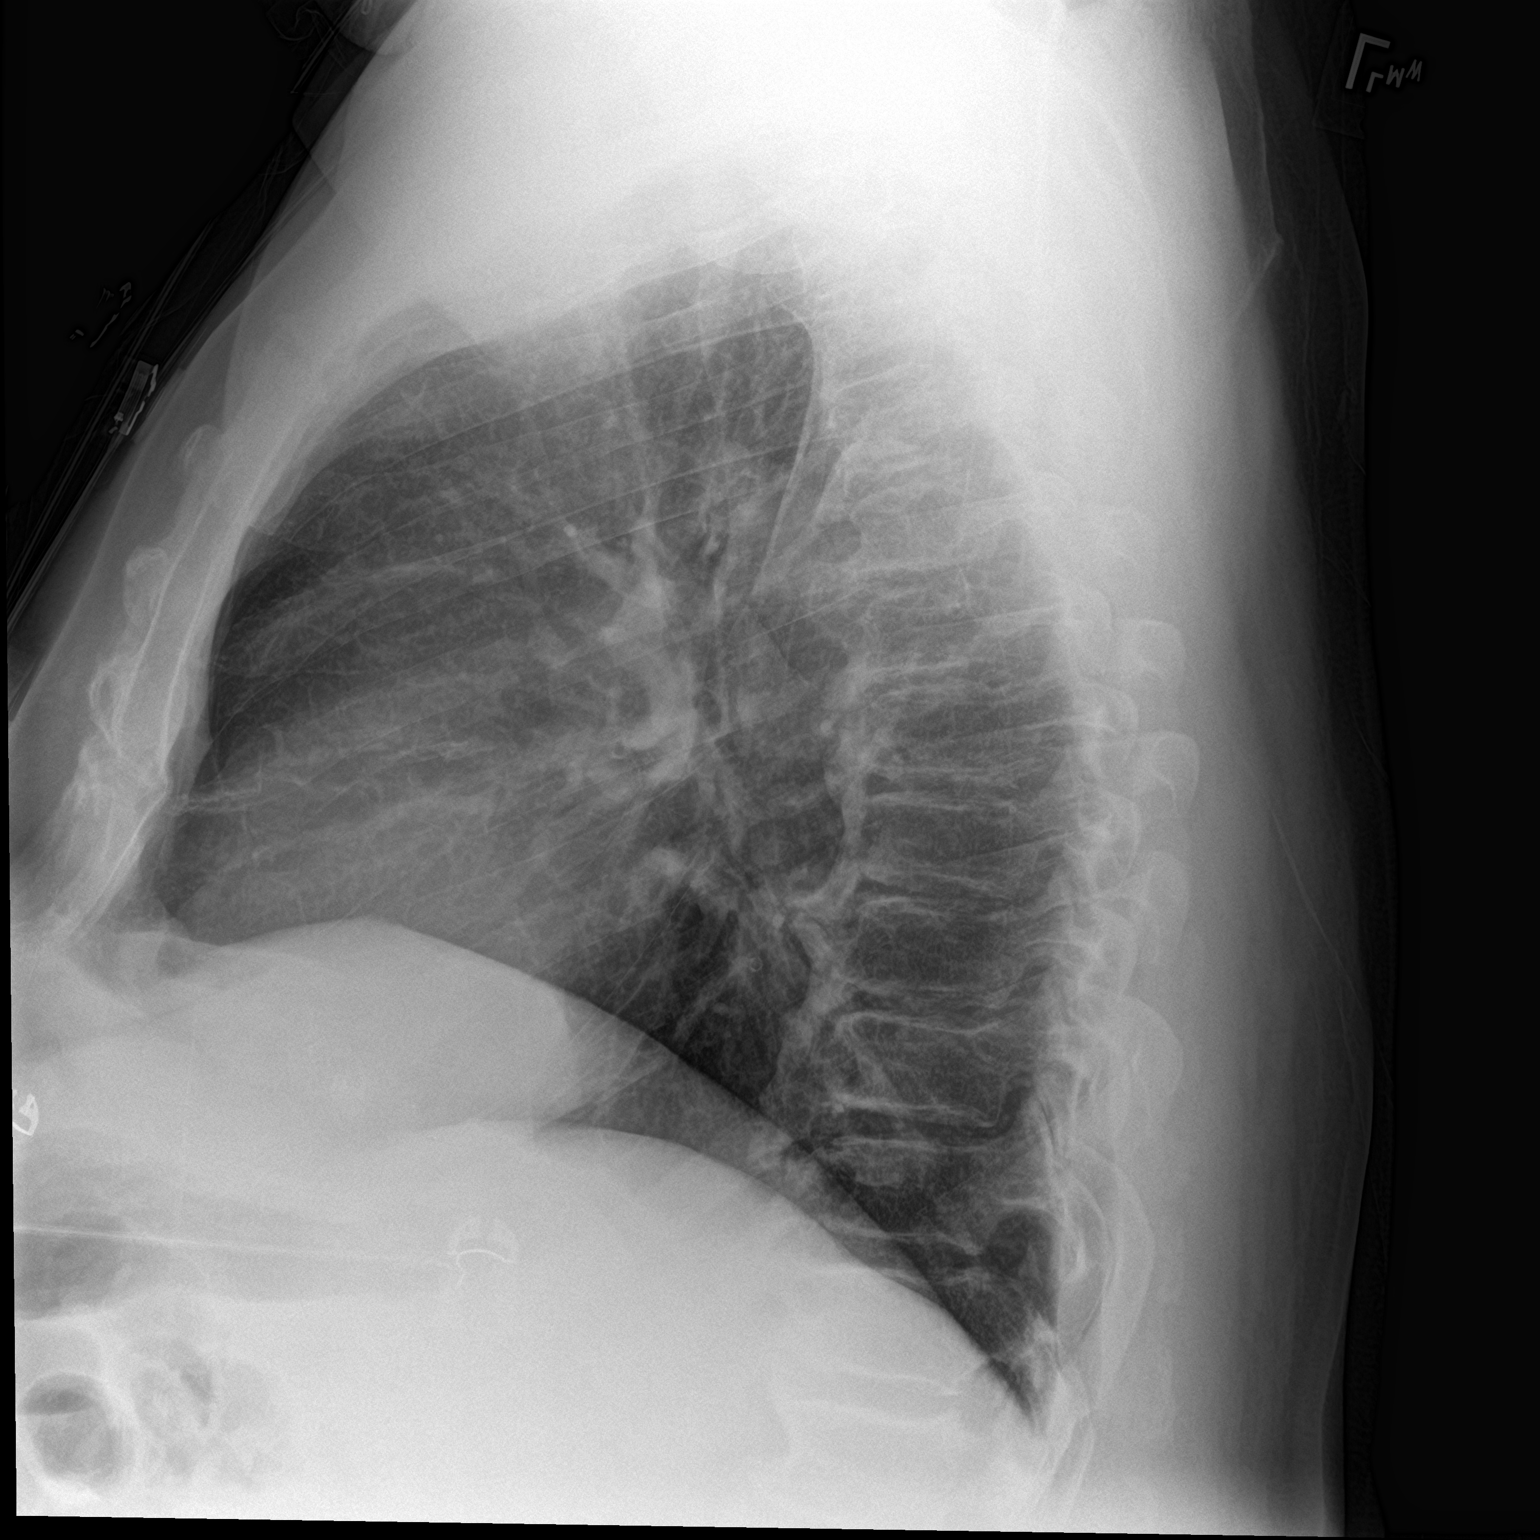

[2 of 2 positions shown; findings below may reference images not displayed]

FINDINGS: The cardiomediastinal contours are normal. The lungs are clear.
Pulmonary vasculature is normal. No consolidation, pleural effusion,
or pneumothorax. No acute osseous abnormalities are seen.
Degenerative change in the thoracic spine.
IMPRESSION: No acute pulmonary process.

## 2018-03-21 ENCOUNTER — Emergency Department
Admission: EM | Admit: 2018-03-21 | Discharge: 2018-03-21 | Disposition: A | Discharge status: Home / Self Care | Payer: Medicare Other | Attending: Emergency Medicine | Admitting: Emergency Medicine

## 2018-03-21 ENCOUNTER — Emergency Department: Payer: Medicare Other

## 2018-03-21 ENCOUNTER — Other Ambulatory Visit: Payer: Self-pay

## 2018-03-21 DIAGNOSIS — Z87891 Personal history of nicotine dependence: Secondary | ICD-10-CM | POA: Diagnosis not present

## 2018-03-21 DIAGNOSIS — Z7982 Long term (current) use of aspirin: Secondary | ICD-10-CM | POA: Insufficient documentation

## 2018-03-21 DIAGNOSIS — Z79899 Other long term (current) drug therapy: Secondary | ICD-10-CM | POA: Diagnosis not present

## 2018-03-21 DIAGNOSIS — Z794 Long term (current) use of insulin: Secondary | ICD-10-CM | POA: Insufficient documentation

## 2018-03-21 DIAGNOSIS — I2510 Atherosclerotic heart disease of native coronary artery without angina pectoris: Secondary | ICD-10-CM | POA: Diagnosis not present

## 2018-03-21 DIAGNOSIS — I4819 Other persistent atrial fibrillation: Secondary | ICD-10-CM | POA: Insufficient documentation

## 2018-03-21 DIAGNOSIS — E119 Type 2 diabetes mellitus without complications: Secondary | ICD-10-CM | POA: Insufficient documentation

## 2018-03-21 DIAGNOSIS — Z7901 Long term (current) use of anticoagulants: Secondary | ICD-10-CM | POA: Insufficient documentation

## 2018-03-21 DIAGNOSIS — I509 Heart failure, unspecified: Secondary | ICD-10-CM | POA: Diagnosis not present

## 2018-03-21 DIAGNOSIS — I499 Cardiac arrhythmia, unspecified: Secondary | ICD-10-CM | POA: Diagnosis present

## 2018-03-21 DIAGNOSIS — I110 Hypertensive heart disease with heart failure: Secondary | ICD-10-CM | POA: Diagnosis not present

## 2018-03-21 DIAGNOSIS — I4811 Longstanding persistent atrial fibrillation: Secondary | ICD-10-CM

## 2018-03-21 LAB — MAGNESIUM: Magnesium: 1.6 mg/dL — ABNORMAL LOW (ref 1.7–2.4)

## 2018-03-21 LAB — BASIC METABOLIC PANEL
ANION GAP: 9 (ref 5–15)
BUN: 11 mg/dL (ref 8–23)
CALCIUM: 8.3 mg/dL — AB (ref 8.9–10.3)
CO2: 25 mmol/L (ref 22–32)
Chloride: 105 mmol/L (ref 98–111)
Creatinine, Ser: 1 mg/dL (ref 0.61–1.24)
GLUCOSE: 189 mg/dL — AB (ref 70–99)
POTASSIUM: 3.7 mmol/L (ref 3.5–5.1)
Sodium: 139 mmol/L (ref 135–145)

## 2018-03-21 LAB — CBC
HCT: 34.8 % — ABNORMAL LOW (ref 39.0–52.0)
HEMOGLOBIN: 11.1 g/dL — AB (ref 13.0–17.0)
MCH: 32.2 pg (ref 26.0–34.0)
MCHC: 31.9 g/dL (ref 30.0–36.0)
MCV: 100.9 fL — ABNORMAL HIGH (ref 80.0–100.0)
NRBC: 0 % (ref 0.0–0.2)
PLATELETS: 175 10*3/uL (ref 150–400)
RBC: 3.45 MIL/uL — ABNORMAL LOW (ref 4.22–5.81)
RDW: 16.6 % — ABNORMAL HIGH (ref 11.5–15.5)
WBC: 7.2 10*3/uL (ref 4.0–10.5)

## 2018-03-21 LAB — PROTIME-INR
INR: 1.76
PROTHROMBIN TIME: 20.3 s — AB (ref 11.4–15.2)

## 2018-03-21 LAB — TROPONIN I
TROPONIN I: 0.03 ng/mL — AB (ref <0.03)
TROPONIN I: 0.03 ng/mL — AB (ref <0.03)

## 2018-03-21 MED ORDER — FUROSEMIDE 10 MG/ML IJ SOLN
20.0000 mg | Freq: Once | INTRAMUSCULAR | Status: AC
  Administered 2018-03-21: 20 mg via INTRAVENOUS
  Filled 2018-03-21: qty 4

## 2018-03-21 MED ORDER — FUROSEMIDE 20 MG PO TABS: 20.0000 mg | ORAL_TABLET | Freq: Every day | ORAL | 1 refills | Status: DC

## 2018-03-21 MED ORDER — MAGNESIUM SULFATE 2 GM/50ML IV SOLN
2.0000 g | Freq: Once | INTRAVENOUS | Status: AC
  Administered 2018-03-21: 2 g via INTRAVENOUS
  Filled 2018-03-21: qty 50

## 2018-03-21 MED ORDER — FUROSEMIDE 20 MG PO TABS: 20.0000 mg | ORAL_TABLET | Freq: Every day | ORAL | 1 refills | Status: AC

## 2018-03-21 NOTE — ED Provider Notes (Signed)
[facility] Emergency Department Provider Note  ____________________________________________  Time seen: Approximately 6:18 PM  I have reviewed the triage vital signs and the nursing notes.   HISTORY  Chief Complaint Irregular Heart Beat   HPI Kenneth HumphreysJoseph Attila Hahn is a 67 y.o. male with a history of atrial fibrillation on Coumadin, diabetes, hypertension and hyperlipidemia who presents for evaluation of elevated heart rate.  Patient reports that he went to see his primary care doctor today for his annual checkup and he was found to be tachycardic.  He was completely asymptomatic from it but was told to come to the emergency room to be evaluated.  Patient reports taking his usual dose of Cardizem and metoprolol later this morning which might have contributed to his tachycardia.  He denies palpitations, chest pain or dizziness.  Patient does endorse dyspnea with exertion which has been mild and present over the last 4 months.  That coincided with patient stopping Lasix.  Patient tells me that he took himself off of Lasix because it made him urinate all the time.  He has noted significant swelling of both of his lower extremities over the last week.  Past Medical History:  Diagnosis Date  . Atrial fibrillation (HCC)   . Colon polyps   . Diabetes (HCC)   . Hyperlipemia   . Hypertension     Patient Active Problem List   Diagnosis Date Noted  . A-fib (HCC) 03/01/2016  . Atrial fibrillation with rapid ventricular response (HCC) 02/29/2016  . CAD (coronary artery disease) 01/04/2016  . Unstable angina (HCC) 12/27/2015  . Atrial fibrillation (HCC) 06/02/2012  . HTN (hypertension) 06/02/2012  . DM (diabetes mellitus) (HCC) 06/02/2012  . Hx of colonic polyps 06/02/2012  . Chronic anticoagulation 06/02/2012    Past Surgical History:  Procedure Laterality Date  . CARDIAC CATHETERIZATION Left 01/04/2016   Procedure: Left Heart Cath and Coronary Angiography;   Surgeon: Lamar BlinksBruce J Kowalski, MD;  Location: [facility] INVASIVE CV LAB;  Service: Cardiovascular;  Laterality: Left;  . CARDIAC CATHETERIZATION N/A 01/04/2016   Procedure: Coronary Stent Intervention;  Surgeon: Marcina MillardAlexander Paraschos, MD;  Location: [facility] INVASIVE CV LAB;  Service: Cardiovascular;  Laterality: N/A;  . CARPAL TUNNEL RELEASE     right hand  . ROTATOR CUFF REPAIR     right shoulder  . UMBILICAL HERNIA REPAIR      Prior to Admission medications   Medication Sig Start Date End Date Taking? Authorizing Provider  allopurinol (ZYLOPRIM) 100 MG tablet Take 100 mg by mouth daily. 10/23/17   [provider]  amLODipine (NORVASC) 5 MG tablet Take by mouth. 12/10/13   [provider]  aspirin EC 81 MG tablet Take by mouth.    [provider]  atenolol (TENORMIN) 25 MG tablet Take 25 mg by mouth 2 (two) times daily. Take one tablet morning and evening    [provider]  benazepril (LOTENSIN) 20 MG tablet Take 20 mg by mouth daily.    [provider]  clopidogrel (PLAVIX) 75 MG tablet Take 1 tablet (75 mg total) by mouth daily with breakfast. 01/05/16   Lamar BlinksKowalski, Bruce J, MD  diltiazem (CARDIZEM CD) 240 MG 24 hr capsule Take 1 capsule (240 mg total) by mouth daily. 03/02/16   [name], Qing, MD  furosemide (LASIX) 20 MG tablet Take 1 tablet (20 mg total) by mouth daily. 03/21/18   Nita SickleVeronese, [place], MD  gabapentin (NEURONTIN) 100 MG capsule Take by mouth. 02/20/17 02/20/18  [provider]  glimepiride (AMARYL) 2 MG tablet Take 2 mg by mouth daily. 09/19/17   [provider]  insulin glargine (LANTUS) 100 UNIT/ML injection Inject 0.62 mLs (62 Units total) into the skin at bedtime. Patient taking differently: Inject 52 Units into the skin at bedtime.  01/04/16   Lamar Blinks, MD  metoprolol succinate (TOPROL-XL) 50 MG 24 hr tablet Take by mouth. 05/29/17 05/29/18  [provider]  Multiple Vitamin (MULTIVITAMIN) tablet Take 1 tablet by  mouth daily.    [provider]  Multiple Vitamins-Minerals (PRESERVISION AREDS 2+MULTI VIT) CAPS Take 1 capsule by mouth 2 (two) times daily.    [provider]  tamsulosin (FLOMAX) 0.4 MG CAPS capsule Take 0.4 mg by mouth daily. 09/02/17   [provider]  warfarin (COUMADIN) 3 MG tablet Take 1-3 mg by mouth one time only at 6 PM. Take 3mg  every day, except Thursday, take 4.5mg     [provider]    Allergies Metformin  Family History  Adopted: Yes  Problem Relation Age of Onset  . Diabetes Father   . Heart disease Father   . Squamous cell carcinoma Sister   . Lymphoma Mother     Social History Social History   Tobacco Use  . Smoking status: Former Smoker    Last attempt to quit: 03/26/1973    Years since quitting: 45.0  . Smokeless tobacco: Never Used  Substance Use Topics  . Alcohol use: Yes    Comment: 2 drinks a day  . Drug use: No    Review of Systems  Constitutional: Negative for fever. Eyes: Negative for visual changes. ENT: Negative for sore throat. Neck: No neck pain  Cardiovascular: Negative for chest pain. + accelerated heart rate Respiratory: Negative for shortness of breath. + DOE Gastrointestinal: Negative for abdominal pain, vomiting or diarrhea. Genitourinary: Negative for dysuria. Musculoskeletal: Negative for back pain. + b/l leg swelling Skin: Negative for rash. Neurological: Negative for headaches, weakness or numbness. Psych: No SI or HI  ____________________________________________   PHYSICAL EXAM:  VITAL SIGNS: ED Triage Vitals  Enc Vitals Group     BP 03/21/18 1536 135/69     Pulse Rate 03/21/18 1536 (!) 106     Resp 03/21/18 1536 19     Temp 03/21/18 1536 98 F (36.7 C)     Temp Source 03/21/18 1536 Oral     SpO2 03/21/18 1536 98 %     Weight 03/21/18 1538 285 lb (129.3 kg)     Height 03/21/18 1538 5\' 10"  (1.778 m)     Head Circumference --      Peak Flow --      Pain Score 03/21/18 1543 0      Pain Loc --      Pain Edu? --      Excl. in GC? --     Constitutional: Alert and oriented. Well appearing and in no apparent distress. HEENT:      Head: Normocephalic and atraumatic.         Eyes: Conjunctivae are normal. Sclera is non-icteric.       Mouth/Throat: Mucous membranes are moist.       Neck: Supple with no signs of meningismus. Cardiovascular: Irregularly irregular rhythm with normal rate. No murmurs, gallops, or rubs. 2+ symmetrical distal pulses are present in all extremities. No JVD. Respiratory: Normal respiratory effort. Lungs are clear to auscultation bilaterally. No wheezes, crackles, or rhonchi.  Gastrointestinal: Soft, non tender, and non distended with positive bowel sounds.  No rebound or guarding. Musculoskeletal: 2+ pitting edema bilateral lower extremities  neurologic: Normal speech and language. Face is symmetric. Moving all extremities. No gross focal neurologic deficits are appreciated. Skin: Skin is warm, dry and intact. No rash noted. Psychiatric: Mood and affect are normal. Speech and behavior are normal.  ____________________________________________   LABS (all labs ordered are listed, but only abnormal results are displayed)  Labs Reviewed  BASIC METABOLIC PANEL - Abnormal; Notable for the following components:      Result Value   Glucose, Bld 189 (*)    Calcium 8.3 (*)    All other components within normal limits  CBC - Abnormal; Notable for the following components:   RBC 3.45 (*)    Hemoglobin 11.1 (*)    HCT 34.8 (*)    MCV 100.9 (*)    RDW 16.6 (*)    All other components within normal limits  PROTIME-INR - Abnormal; Notable for the following components:   Prothrombin Time 20.3 (*)    All other components within normal limits  TROPONIN I - Abnormal; Notable for the following components:   Troponin I 0.03 (*)    All other components within normal limits  MAGNESIUM - Abnormal; Notable for the following components:   Magnesium 1.6 (*)      All other components within normal limits  TROPONIN I - Abnormal; Notable for the following components:   Troponin I 0.03 (*)    All other components within normal limits   ____________________________________________  EKG  ED ECG REPORT I, Nita Sicklearolina Aedan Geimer, the attending physician, personally viewed and interpreted this ECG.  Atrial fibrillation, ventricular rate of 115, normal QRS and QTC, right axis deviation, anterior Q waves with no ST elevations or depressions.  Unchanged from prior ____________________________________________  RADIOLOGY  I have personally reviewed the images performed during this visit and I agree with the Radiologist's read.   Interpretation by Radiologist:  Dg Chest 2 View  Result Date: 03/21/2018 CLINICAL DATA:  Atrial fibrillation for 1 day, former smoker EXAM: CHEST - 2 VIEW COMPARISON:  Chest x-ray of 02/29/2016 FINDINGS: Mild cardiomegaly is stable. There are now small pleural effusions and there is mild pulmonary vascular congestion indicating mild CHF. No focal infiltrate is seen. There are degenerative changes within the mid to lower thoracic spine. IMPRESSION: Mild CHF with cardiomegaly, small effusions, and mild pulmonary vascular congestion. Electronically Signed   By: Dwyane DeePaul  Barry M.D.   On: 03/21/2018 16:14     ____________________________________________   PROCEDURES  Procedure(s) performed: None Procedures Critical Care performed:  None ____________________________________________   INITIAL IMPRESSION / ASSESSMENT AND PLAN / ED COURSE   67 y.o. male with a history of atrial fibrillation on Coumadin, diabetes, hypertension and hyperlipidemia who presents for evaluation of elevated heart rate.  Patient is completely asymptomatic.  Currently in A. fib with ventricular rate in the 90s.  Normal electrolytes.  EKG with no ischemic changes.  First troponin is borderline at 0.03.  Will repeat a troponin.  Most likely patient had an episode  of RVR due to the late morning meds.  Patient does look volume overloaded on exam with 2+ pitting edema and also history of dyspnea on exertion over the last several months since that he self discontinued his Lasix.  Chest x-ray showing mild CHF with cardiomegaly, small effusions and mild pulmonary vascular congestion.  Explained to the patient importance of staying on Lasix.  Will check a second troponin and give a dose of IV Lasix.  Will restart patient on Lasix and refer him back to his cardiologist for further evaluation. No evidence of anemia, AKI, or electrolyte abnormalities.   Clinical Course as of Mar 21 1929  Fri Mar 21, 2018  1925 Magnesium slightly low for which patient was supplemented IV.  Potassium normal.  Patient remains in A. fib with well-controlled rates.  His INR is subtherapeutic and his PCP has addressed this problem today increasing his dose.  Patient has had 250 cc of urine output after receiving IV Lasix.  Will restart him on Lasix 20 mg and recommended close follow-up with Dr. Gwen Pounds on Monday.  Discussed return precautions with patient and his wife.   [CV]    Clinical Course User Index [CV] Don Perking [place], MD     As part of my medical decision making, I reviewed the following data within the electronic MEDICAL RECORD NUMBER Nursing notes reviewed and incorporated, Labs reviewed , EKG interpreted , Old EKG reviewed, Old chart reviewed, Radiograph reviewed , Notes from prior ED visits and [place] Controlled Substance Database    Pertinent labs & imaging results that were available during my care of the patient were reviewed by me and considered in my medical decision making (see chart for details).    ____________________________________________   FINAL CLINICAL IMPRESSION(S) / ED DIAGNOSES  Final diagnoses:  Acute on chronic congestive heart failure, unspecified heart failure type (HCC)  Longstanding persistent atrial fibrillation      NEW MEDICATIONS STARTED  DURING THIS VISIT:  ED Discharge Orders         Ordered    furosemide (LASIX) 20 MG tablet  Daily     03/21/18 1929           Note:  This document was prepared using Dragon voice recognition software and may include unintentional dictation errors.    Nita Sickle, MD 03/21/18 [phone]

## 2018-03-21 NOTE — ED Notes (Signed)
Peripheral IV discontinued. Catheter intact. No signs of infiltration or redness. Gauze applied to IV site.  Discharge instructions reviewed with patient. Questions fielded by this RN. Patient verbalizes understanding of instructions. Patient discharged home in stable condition per veronese. No acute distress noted at time of discharge.   

## 2018-03-21 NOTE — Discharge Instructions (Addendum)
Take 20 mg of Lasix every day as prescribed in the morning.  Follow-up with Dr. Gwen PoundsKowalski on Monday.  Return to the emergency room for shortness of breath, chest pain, palpitations or dizziness.

## 2018-03-21 NOTE — ED Triage Notes (Signed)
Pt states that he was at his primary care dr for a check up and states that he was told that he needed to go to the ER because his heart rate was all over the place, 149 a.fib rvr in the office, pt denies symptoms at that time but states that he has been more sob than normal especially when he wakes up in the am. No distress noted at this time

## 2018-08-25 DEATH — deceased

## 2019-08-02 IMAGING — CR DG KNEE COMPLETE 4+V*R*
4 series · 4 of 4 positions shown · non-contrast
Comparison: None.

CLINICAL DATA: Status post fall.  Pain.

EXAM:
RIGHT KNEE - COMPLETE 4+ VIEW

[knee ap]
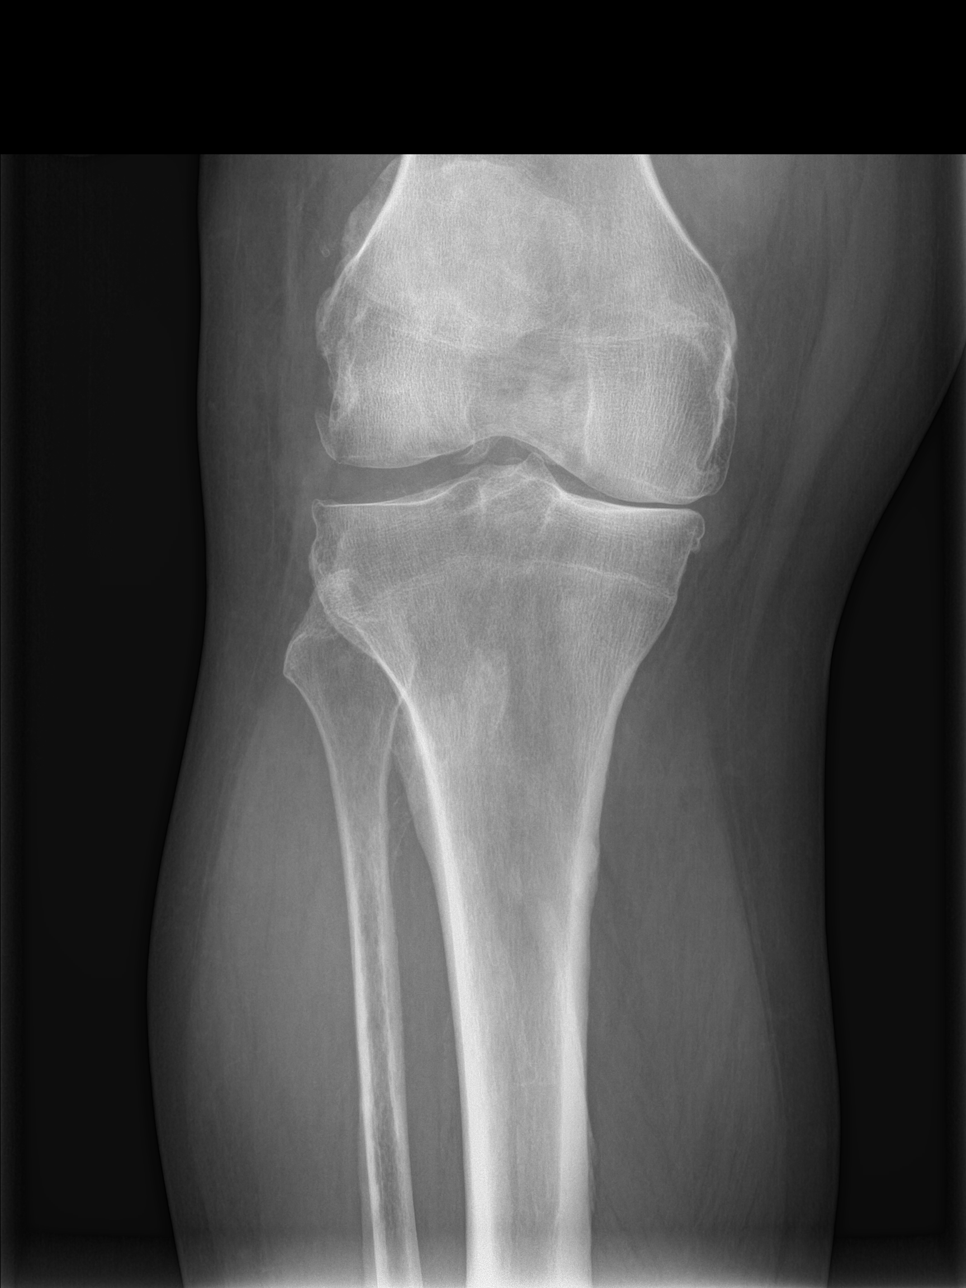

[knee lat]
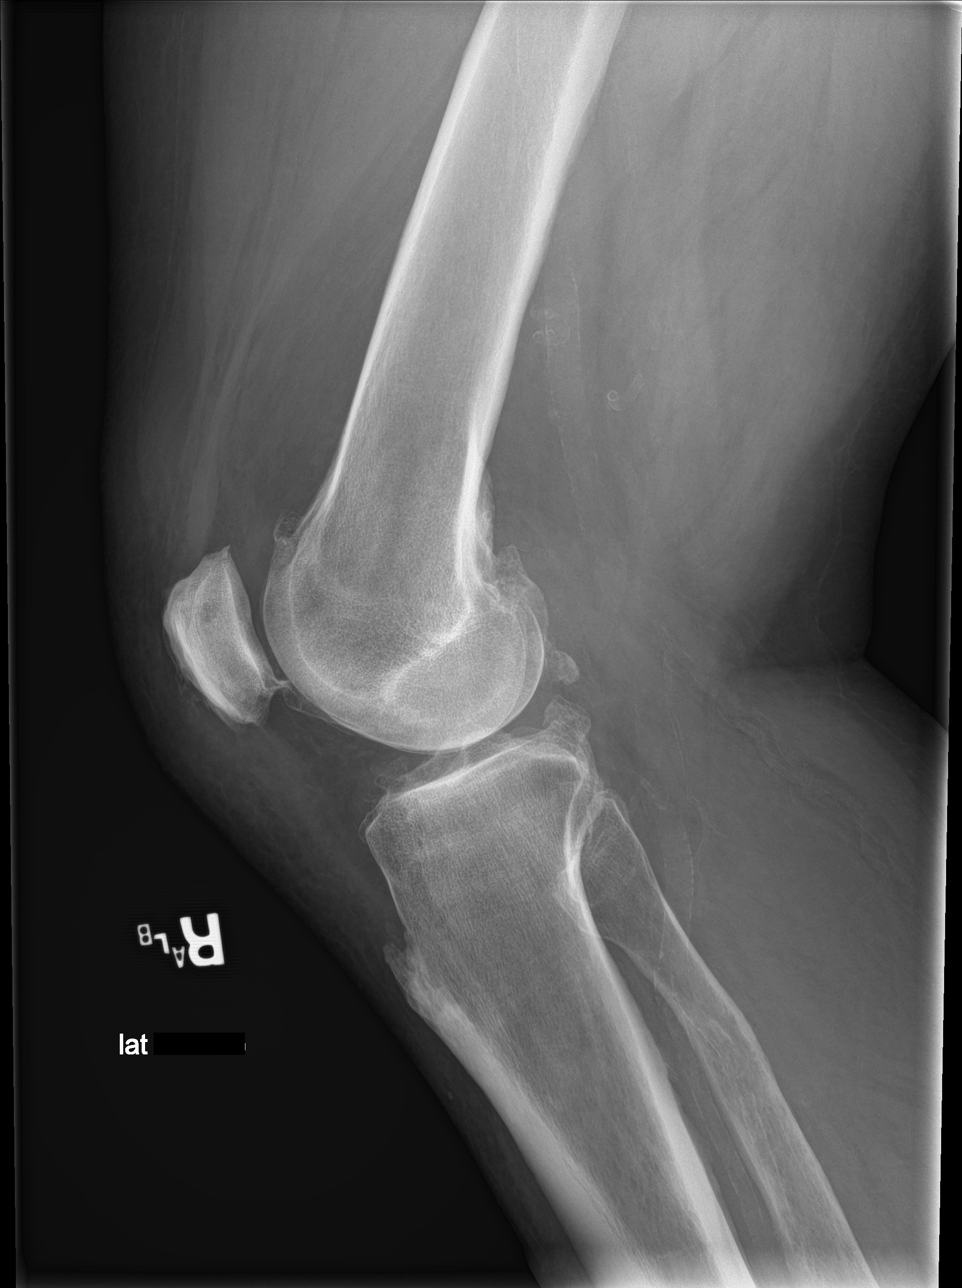

[tunnel]
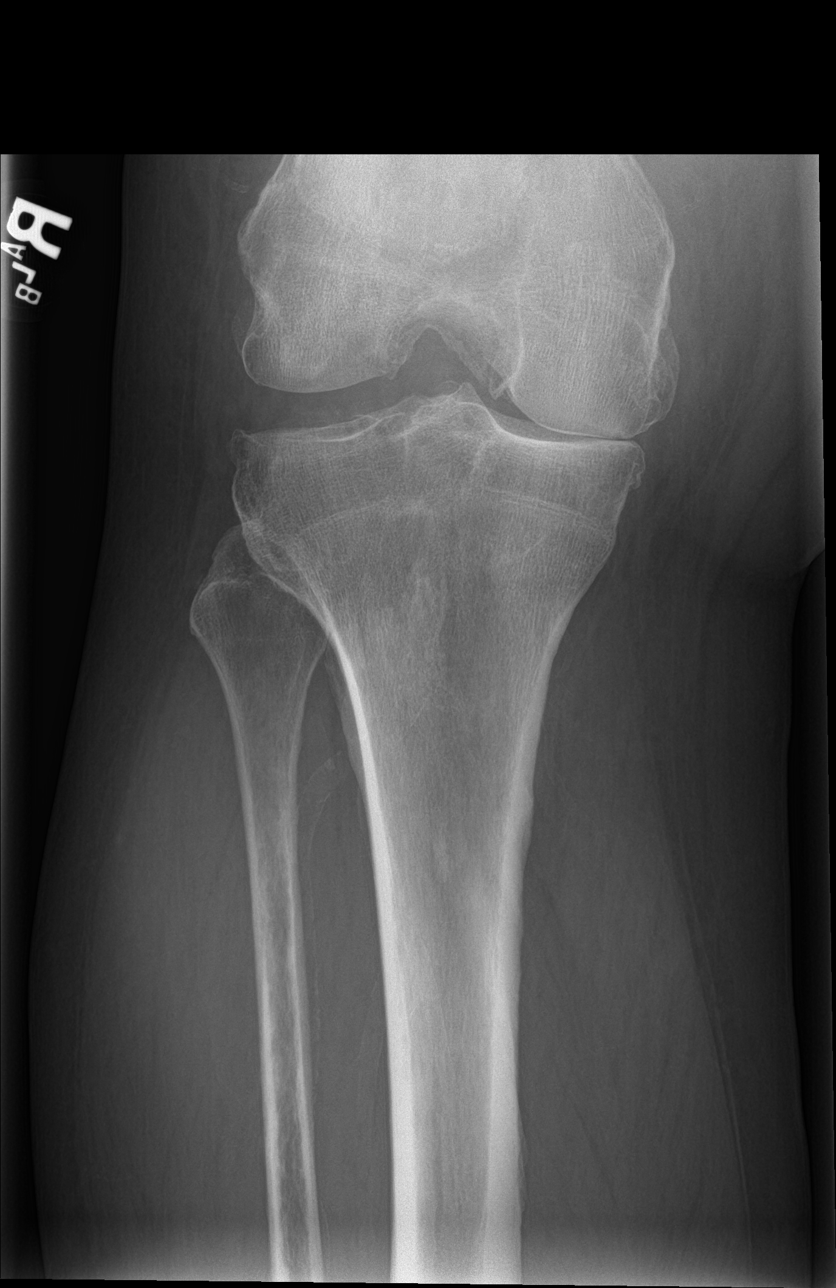

[patella skyline]
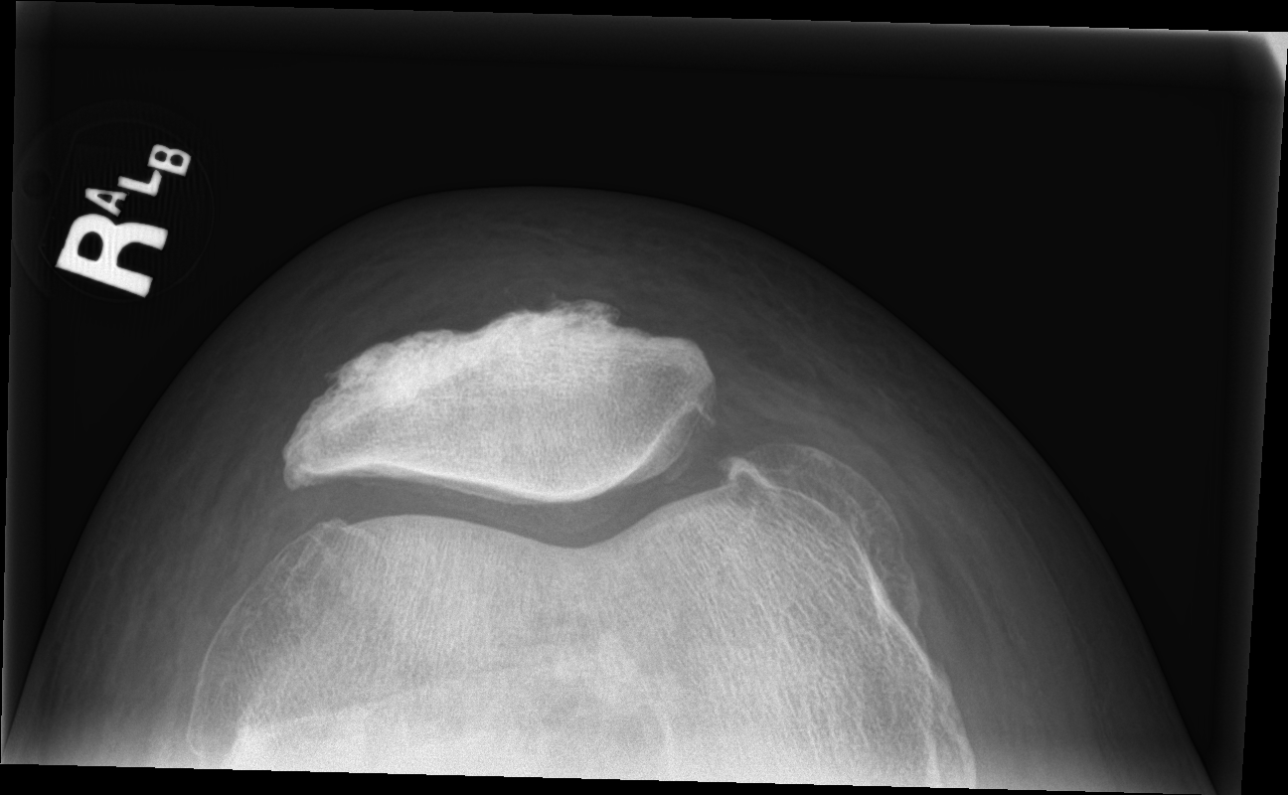

[4 of 4 positions shown; findings below may reference images not displayed]

FINDINGS: No acute fracture or dislocation. Severe medial femorotibial
compartment joint space narrowing. Mild patellofemoral compartment
joint space narrowing with small marginal osteophytes. Lateral
femorotibial compartment marginal osteophytes without significant
joint space narrowing. No significant joint effusion. Peripheral
vascular atherosclerotic disease.
IMPRESSION: 1.  No acute osseous injury of the right knee.
2. Severe right medial femorotibial compartment osteoarthritis.

## 2019-12-09 IMAGING — CR DG CHEST 2V
2 series · 2 of 2 positions shown · non-contrast
Comparison: Chest x-ray of 02/29/2016

CLINICAL DATA: Atrial fibrillation for 1 day, former smoker

EXAM:
CHEST - 2 VIEW

[chest pa]
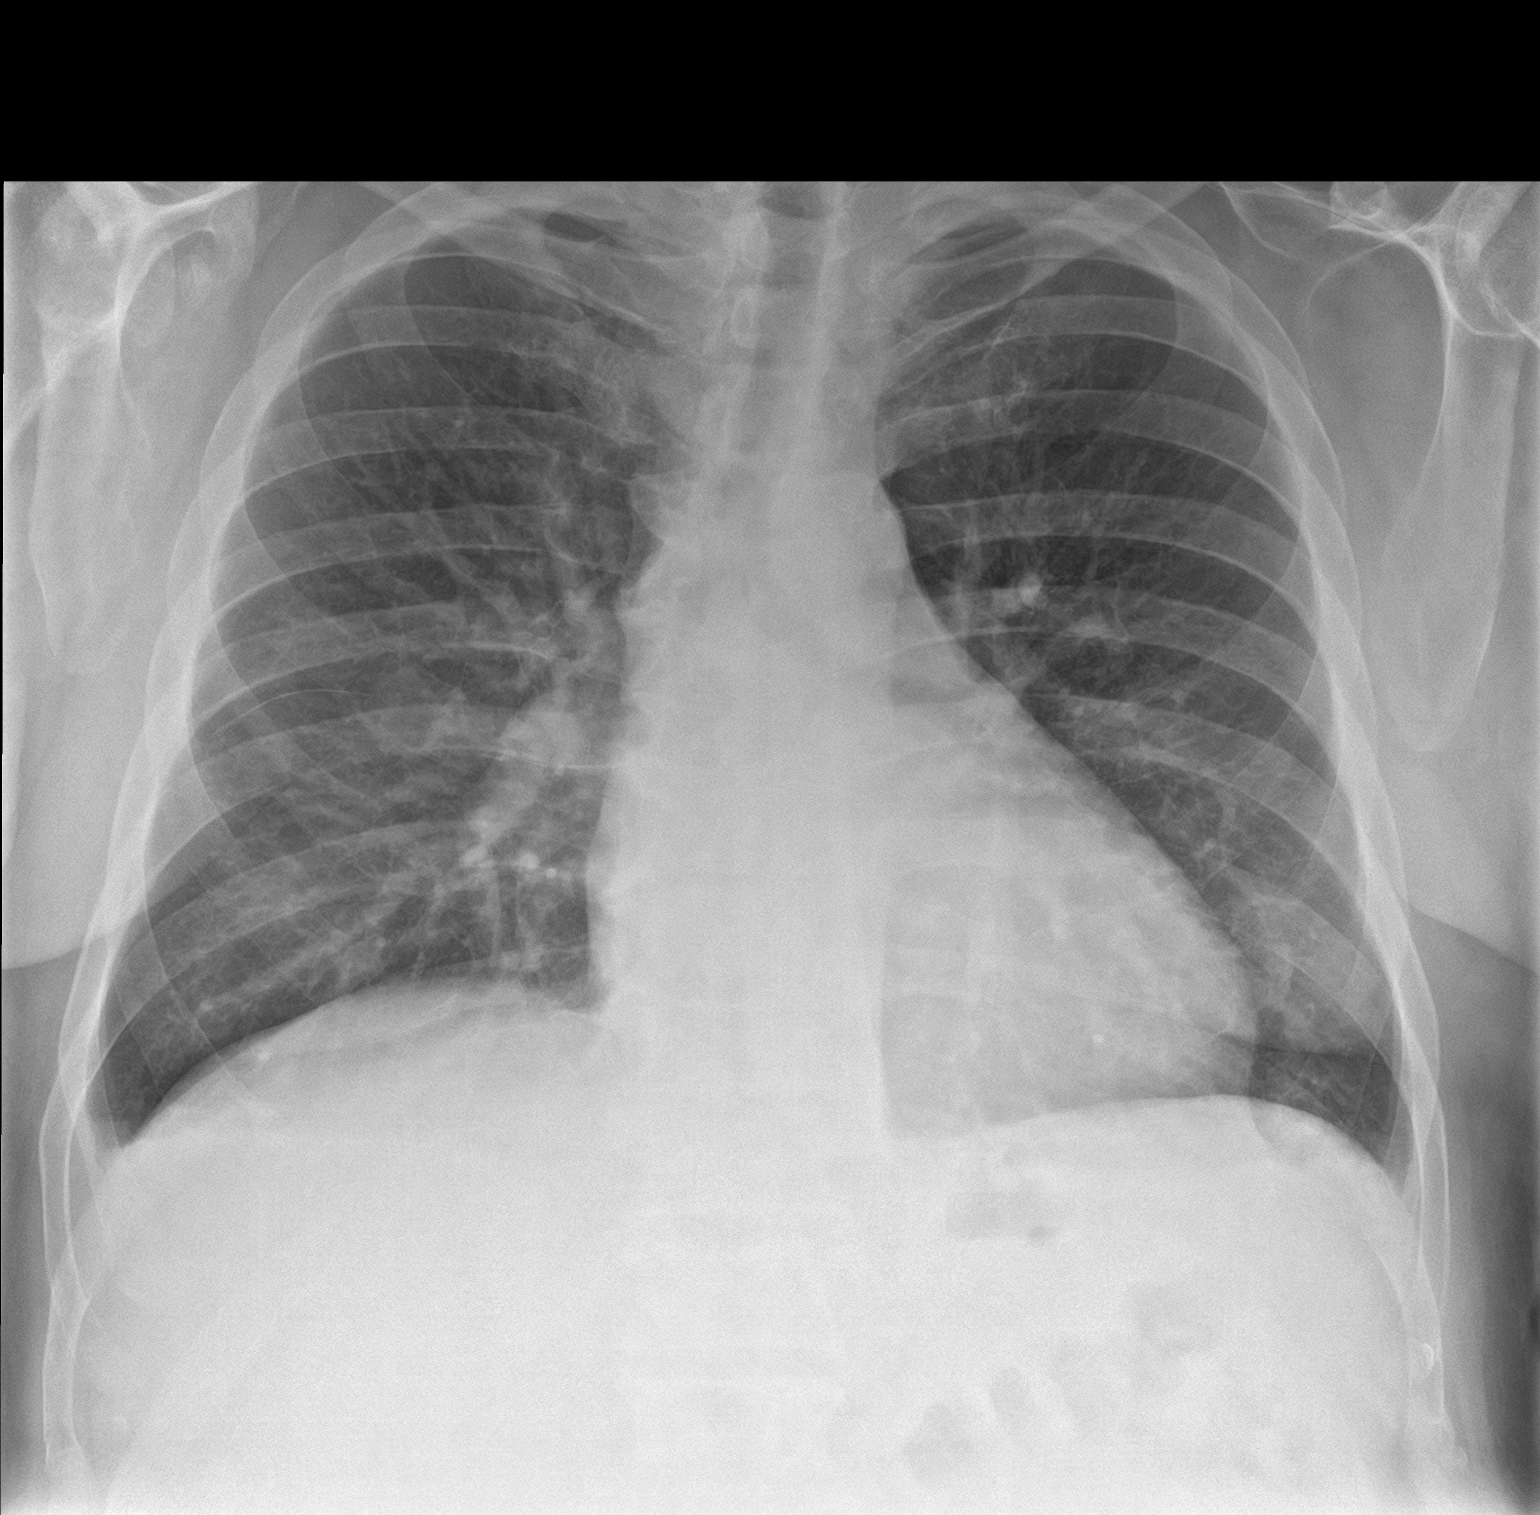

[chest lat]
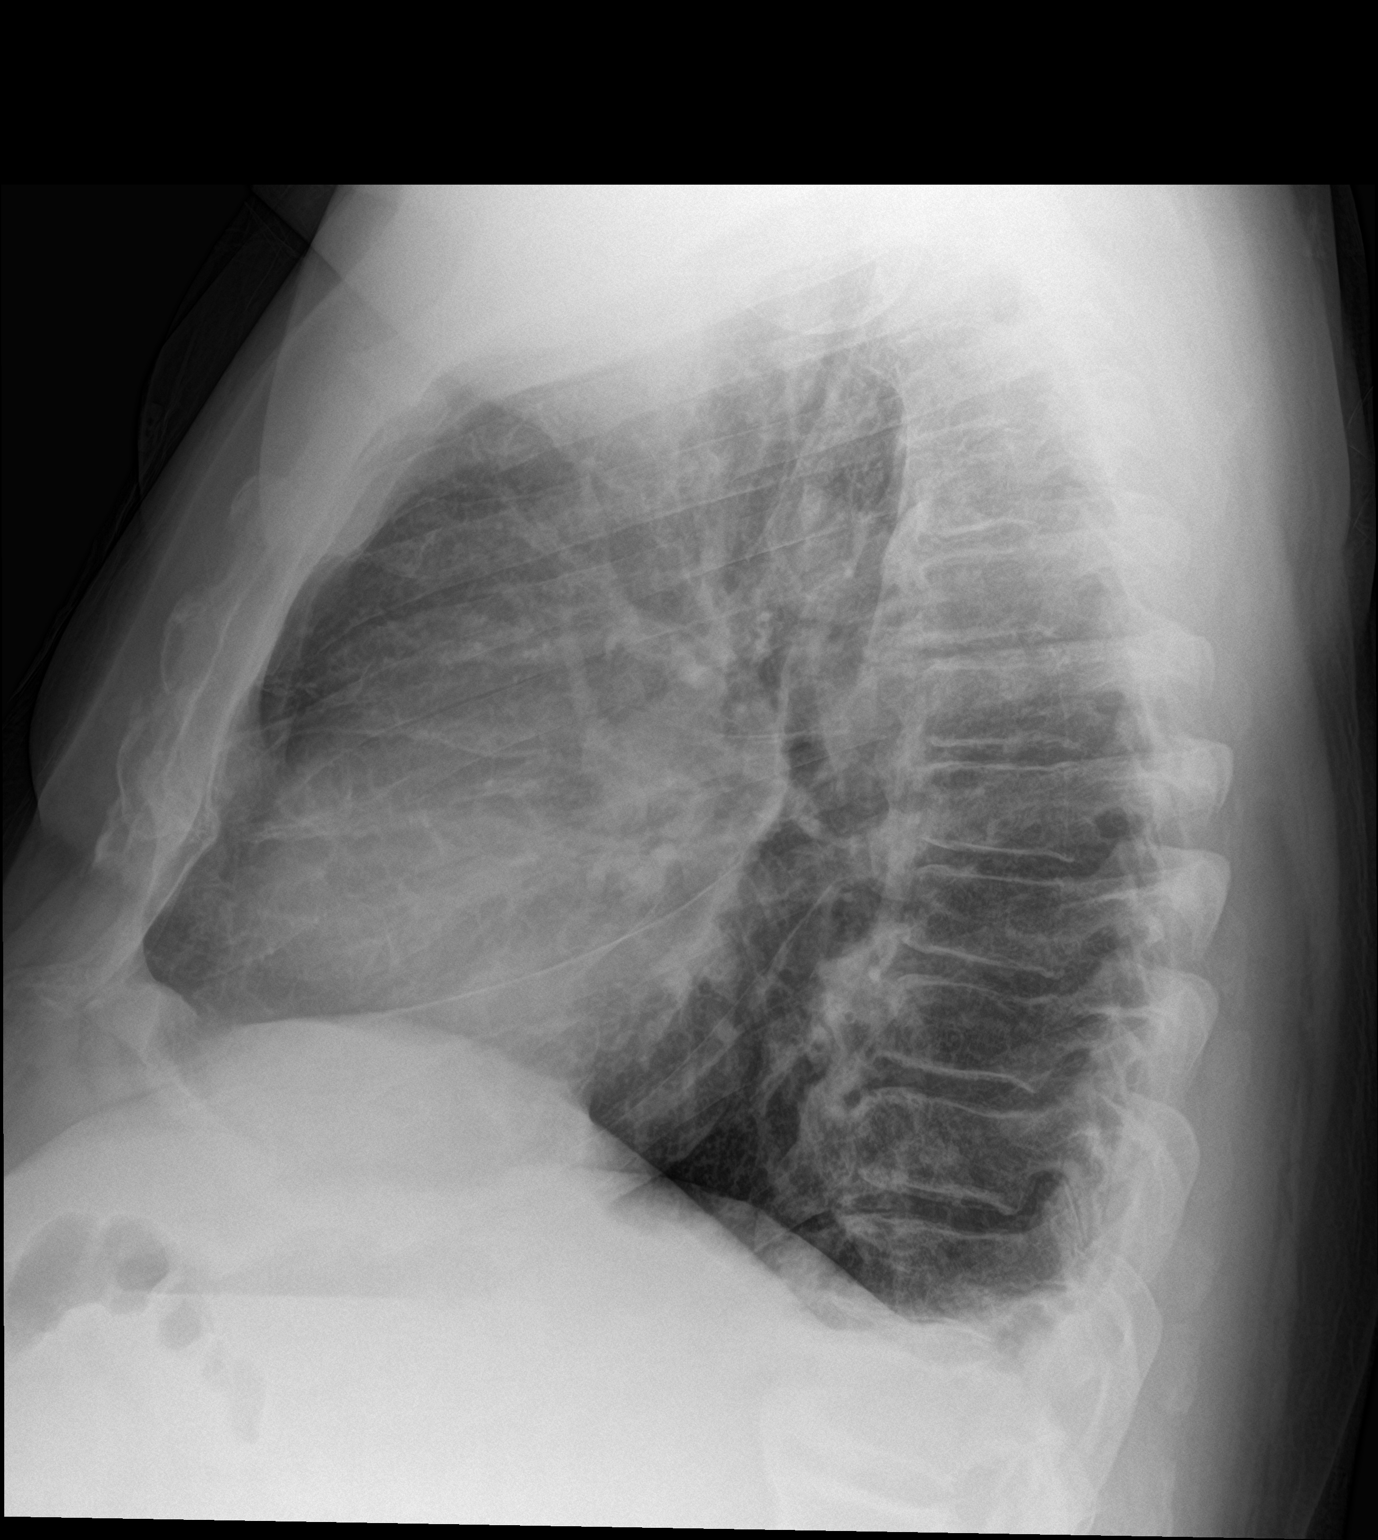

[2 of 2 positions shown; findings below may reference images not displayed]

FINDINGS: Mild cardiomegaly is stable. There are now small pleural effusions
and there is mild pulmonary vascular congestion indicating mild CHF.
No focal infiltrate is seen. There are degenerative changes within
the mid to lower thoracic spine.
IMPRESSION: Mild CHF with cardiomegaly, small effusions, and mild pulmonary
vascular congestion.
